# Patient Record
Sex: Female | Born: 1975 | Marital: Single | Smoking: Never smoker
Health system: Southern US, Community
[De-identification: ages and names within clinical notes are randomized; demographics above are authoritative.]

## PROBLEM LIST (undated history)

## (undated) ENCOUNTER — Emergency Department (HOSPITAL_COMMUNITY): Admission: EM | Payer: BLUE CROSS/BLUE SHIELD | Source: Home / Self Care

---

## 2004-10-22 ENCOUNTER — Observation Stay: Payer: Self-pay

## 2005-01-27 ENCOUNTER — Observation Stay: Payer: Self-pay | Admitting: Obstetrics and Gynecology

## 2005-02-11 ENCOUNTER — Inpatient Hospital Stay: Payer: Self-pay

## 2011-03-21 ENCOUNTER — Inpatient Hospital Stay: Payer: Self-pay | Admitting: Specialist

## 2011-03-21 DIAGNOSIS — I6789 Other cerebrovascular disease: Secondary | ICD-10-CM

## 2012-05-10 ENCOUNTER — Emergency Department: Payer: Self-pay | Admitting: Emergency Medicine

## 2013-08-29 DIAGNOSIS — T2020XA Burn of second degree of head, face, and neck, unspecified site, initial encounter: Secondary | ICD-10-CM | POA: Insufficient documentation

## 2018-06-23 ENCOUNTER — Encounter (INDEPENDENT_AMBULATORY_CARE_PROVIDER_SITE_OTHER): Payer: Self-pay

## 2018-06-23 ENCOUNTER — Ambulatory Visit
Admission: RE | Admit: 2018-06-23 | Discharge: 2018-06-23 | Disposition: A | Discharge status: Home / Self Care | Payer: Self-pay | Source: Ambulatory Visit | Attending: Oncology | Admitting: Oncology

## 2018-06-23 ENCOUNTER — Ambulatory Visit: Payer: Self-pay | Attending: Oncology

## 2018-06-23 VITALS — BP 133/76 | HR 72 | Temp 98.4°F | Ht 64.25 in | Wt 134.3 lb

## 2018-06-23 DIAGNOSIS — Z0000 Encounter for general adult medical examination without abnormal findings: Secondary | ICD-10-CM | POA: Insufficient documentation

## 2018-06-23 NOTE — Progress Notes (Signed)
  Subjective:     Patient ID: Kathleen KoyanagiHilda Rodriguez Knight, female   DOB: [birth year], 42 y.o.   MRN: [id number]  HPI   Review of Systems     Objective:   Physical Exam  Pulmonary/Chest: Right breast exhibits no inverted nipple, no mass, no nipple discharge, no skin change and no tenderness. Left breast exhibits no inverted nipple, no mass, no nipple discharge, no skin change and no tenderness. Breasts are symmetrical.  Left breast larger than right       Assessment:     42 year old hispanic patient presents for BCCCP clinic visit.  Patient screened, and meets BCCCP eligibility.  Patient does not have insurance, Medicare or Medicaid.  Handout given on Affordable Care Act.  Instructed patient on breast self awareness using teach back method.  Clinical breast exam unremarkable.  No mass or lump palpated.   Patient's sister diagnosed with breast cancer at age 43 in [place]. Kathleen HaringLoyda Knight interpreted exam.    Risk Assessment    Risk Scores      06/23/2018   Last edited by: Alta Corningover, Melissa G, CMA   5-year risk: 1.4 %   Lifetime risk: 14.6 %              Plan:     Sent for bilateral screening baseline mammogram.

## 2018-06-24 ENCOUNTER — Other Ambulatory Visit: Payer: Self-pay

## 2018-06-24 DIAGNOSIS — R920 Mammographic microcalcification found on diagnostic imaging of breast: Secondary | ICD-10-CM

## 2018-07-07 ENCOUNTER — Ambulatory Visit
Admission: RE | Admit: 2018-07-07 | Discharge: 2018-07-07 | Disposition: A | Discharge status: Home / Self Care | Payer: Self-pay | Source: Ambulatory Visit | Attending: Oncology | Admitting: Oncology

## 2018-07-07 DIAGNOSIS — R920 Mammographic microcalcification found on diagnostic imaging of breast: Secondary | ICD-10-CM

## 2018-10-06 ENCOUNTER — Other Ambulatory Visit: Payer: Self-pay

## 2018-10-06 DIAGNOSIS — R920 Mammographic microcalcification found on diagnostic imaging of breast: Secondary | ICD-10-CM

## 2018-10-07 NOTE — Progress Notes (Signed)
Radiologist reviewed mammographic findings, and Birads 3 result with patient.  Letter mailed to patient to notify of scheduled six month follow-up mammogram on 01/07/2019. Copy to [facility].

## 2019-01-07 ENCOUNTER — Other Ambulatory Visit: Payer: Self-pay

## 2019-01-07 ENCOUNTER — Ambulatory Visit
Admission: RE | Admit: 2019-01-07 | Discharge: 2019-01-07 | Disposition: A | Discharge status: Home / Self Care | Payer: Self-pay | Source: Ambulatory Visit | Attending: Oncology | Admitting: Oncology

## 2019-01-07 DIAGNOSIS — R920 Mammographic microcalcification found on diagnostic imaging of breast: Secondary | ICD-10-CM | POA: Insufficient documentation

## 2019-01-11 ENCOUNTER — Encounter: Payer: Self-pay | Admitting: *Deleted

## 2019-01-11 ENCOUNTER — Other Ambulatory Visit: Payer: Self-pay | Admitting: *Deleted

## 2019-01-11 DIAGNOSIS — R920 Mammographic microcalcification found on diagnostic imaging of breast: Secondary | ICD-10-CM

## 2019-01-11 NOTE — Progress Notes (Signed)
Letter mailed to inform patient of her mammogram results and need to return in 6 months.  I have sent her a letter with her appointment for 07/12/19 @ 8:30 for BCCCP and then 10:00 for her mammogram.  Interpreter request place.  [facility] to [place].

## 2019-04-29 ENCOUNTER — Ambulatory Visit (INDEPENDENT_AMBULATORY_CARE_PROVIDER_SITE_OTHER): Payer: BLUE CROSS/BLUE SHIELD | Admitting: Surgery

## 2019-04-29 ENCOUNTER — Other Ambulatory Visit: Payer: Self-pay

## 2019-04-29 ENCOUNTER — Encounter: Payer: Self-pay | Admitting: Surgery

## 2019-04-29 VITALS — BP 108/73 | HR 80 | Temp 97.7°F | Resp 14 | Ht 63.0 in | Wt 138.0 lb

## 2019-04-29 DIAGNOSIS — K644 Residual hemorrhoidal skin tags: Secondary | ICD-10-CM | POA: Diagnosis not present

## 2019-04-29 MED ORDER — LIDOCAINE (ANORECTAL) 5 % EX CREA: 1.0000 "application " | TOPICAL_CREAM | Freq: Four times a day (QID) | CUTANEOUS | 0 refills | Status: DC

## 2019-04-29 NOTE — Patient Instructions (Addendum)
Please pick up your medication at the pharmacy and begin using it today. Please continue to use the suppositories.    Contenido de [place] de los alimentos (Cardinal Health Content in Foods) Consulte la lista a continuacin para Armed forces logistics/support/administrative officer contenido de fibra de algunos alimentos que se consumen con frecuencia. ALIMENTOS CON ALTO CONTENIDO DE FIBRA Los alimentos ricos en fibra contienen 4gramos o ms de fibra por porcin. Estos incluyen los siguientes:  Alcachofa (fresca): 28mediana tiene 10,3g de [place].  Frijoles cocidos, comunes o vegetarianos (en lata), taza tiene 5,2g de fibra.  Arndanos o frambuesas (frescas): taza tiene 4g de fibra.  Cereal de salvado: taza tiene 8,6g de fibra.  Trigo burgol (cocido): taza tiene 4g de [place].  Frijoles (en lata): taza tiene 6,8g de fibra.  Lentejas (cocidas): taza tiene 7,8g de fibra.  Pera (fresca): 52mediana tiene 5,1g de [place].  Guisantes (congelados): taza tiene 4,4g de [place].  Frijoles pintos (en lata): taza tiene 5,5g de fibra.  Frijoles pintos (secos y cocidos): taza tiene 7,7g de [place].  Papa con piel (al horno): 30mediana tiene 4,4g de [place].  Quinua (cocida): taza tiene 5g de fibra.  Porotos de soja (en lata, congelados o frescos): taza tiene 5,1g de fibra. ALIMENTOS CON CONTENIDO MODERADO DE FIBRA Los alimentos con contenido moderado de [place] de 1 a 4gramos de fibra por porcin. Estos incluyen los siguientes:  Almendras: 1onza tiene 3,5g de [place].  Manzana con piel: 47mediana tiene 3,3g de fibra.  Pur de Firefighter (endulzado): taza tiene 1,5g de Pharmacist, hospital.  Bagel solo: un bagel de 4pulgadas (10cm) tiene 2g de fibra.  Banana: 27mediana tiene 3,1g de fibra.  Brcoli (cocido): taza tiene 2,5g de [place].  Zanahorias (cocidas): taza tiene 2,3g de [place].  Maz (en lata o congelado): taza tiene 2,1g de [place].  Tortilla de maz: una tortilla de 6pulgadas (15cm) tiene 1,5g  de [place].  Judas verdes (en lata): taza tiene 2g de fibra.  Avena instantnea: taza tiene aproximadamente 2g de fibra.  Arroz integral de Set designer (cocido): 1taza tiene 3,5g de Pharmacist, hospital.  Macarrones enriquecidos (cocidos): 1 taza tiene 2,5g de [place].  Meln: 1taza tiene 1,4g de fibra.  Multicereales: taza tiene aproximadamente 2 a 4g de fibra.  Naranja: 1pequea tiene 3,1g de fibra.  Pur de papas: taza tiene 1,6g de fibra.  Pasas: taza tiene 1,6g de fibra.  Calabaza: taza tiene 2,9g de [place].  Semillas de girasol: taza tiene 1,1g de [place].  Tomate: 57mediano tiene 1,5g de fibra.  Hamburguesa de vegetales o de soja: 1 tiene 3,4g de [place].  Pan de salvado: 1rebanada tiene 2g de [place].  Espaguetis de salvado: taza tiene 3,2g de fibra. ALIMENTOS CON BAJO CONTENIDO DE FIBRA Los alimentos con bajo contenido de [place] tienen menos de 1gramo de fibra por porcin. Estos incluyen los siguientes:  Huevo: 1grande.  Tortilla de harina: una tortilla de 6pulgadas (15cm).  Jugo de fruta: taza.  Valeda MalmSharen Counter.  Carne de res, ave o pescado: 1onza.  Leche: 1taza.  Espinaca (cruda): 1taza.  Pan blanco: 1rebanada.  Arroz blanco: taza.  Yogur: taza. Las cantidades reales de fibra de los alimentos pueden ser diferentes en funcin del procesamiento. Hable con el nutricionista sobre la cantidad de fibra que necesita en la dieta. Esta informacin no tiene Marine scientist el consejo del mdico. Asegrese de hacerle al mdico cualquier pregunta que tenga. Document Released: 11/01/2012 Document Revised: 08/07/2016 Document Reviewed: 08/30/2015 Elsevier Patient Education  2020 [place] tomar un bao de asiento How  to Take a [employer] bao de asiento es un bao de agua tibia que se puede usar para cuidar el recto, la zona genital o la zona entre el recto y los genitales (perineo). En un bao de asiento, el agua  solamente llega [employer] caderas y [place] las nalgas. Un bao de asiento puede [employer] hogar en la baera o en una tina porttil para bao de asiento que se coloca sobre el inodoro. Su mdico puede recomendar un bao de asiento para ayudarlo con lo siguiente:  Engineer, materials y las molestias despus de dar a Patent examiner.  Aliviar el dolor y la picazn causados por las hemorroides o las fisuras anales.  Aliviar el dolor despus de determinadas cirugas.  Relajar los msculos doloridos o tensos. Cmo tomar un bao de [employer] 3 o 4baos de asiento diarios o tantos como se lo haya indicado el mdico. Bao de asiento en la baera Para tomar un bao de asiento en una baera: 1. Llene parte de la baera con agua tibia. El agua debe tener la profundidad suficiente para cubrirle las caderas y las nalgas cuando est sentado en la baera. 2. Si su mdico le indic que ponga medicamentos en el agua, siga sus instrucciones. 3. Sintese en el agua. 4. Delsa Bern un poco el drenaje de la baera y djelo abierto durante su bao. 5. Abra el agua tibia nuevamente, lo suficiente para reponer Firefighter. Deje correr el agua durante todo su bao. Esto ayuda a Surveyor, minerals en el nivel adecuado y a Landscape architect. 6. Sumrjase en el agua entre 15 y 20 minutos, o el tiempo que le haya indicado el mdico. 7. Cuando termine, tenga cuidado al ponerse de pie. Puede sentirse mareado. 8. Luego del bao de asiento, squese con golpecitos suaves. No frote la piel para secarla.  Bao de asiento sobre el inodoro Para tomar un bao de asiento con un recipiente sobre el inodoro: 1. Siga las instrucciones del fabricante. 2. Llene el recipiente con agua tibia. 3. Si su mdico le indic que ponga medicamentos en el agua, siga sus instrucciones. 4. Sintese en el asiento. Asegrese de que el agua le cubra las nalgas y el perineo. 5. Sumrjase en el agua entre 15 y 20 minutos, o el tiempo que le haya  indicado el mdico. 6. Luego del bao de asiento, squese con golpecitos suaves. No frote la piel para secarla. 7. Limpie y seque la tina despus de cada uso. 8. Deseche el recipiente si se agrieta o segn las instrucciones del fabricante. Comunquese con un mdico si:  Los sntomas empeoran. No contine con los baos de asiento si sus sntomas empeoran.  Aparecen nuevos sntomas. Si esto ocurre, no contine con los baos de asiento hasta hablar con su mdico. Resumen  Un bao de asiento es un bao con agua tibia en el cual el agua solo le llega hasta la cadera y cubre las nalgas.  Un bao de asiento puede [employer] picazn y Chief Technology Officer, y International aid/development worker los msculos doloridos o tensos en la parte inferior del cuerpo, incluida la zona genital.  Alden 3 o 4baos de asiento diarios o tantos como se lo haya indicado el mdico. Sumrjase en el agua entre 15 y 20 minutos.  No contine con los baos de asiento si los sntomas empeoran. Esta informacin no tiene Theme park manager el consejo del mdico. Asegrese de hacerle al mdico cualquier pregunta que tenga. Document Released: 08/18/2006 Document Revised:  08/15/2017 Document Reviewed: 08/15/2017 Elsevier Patient Education  2020 [employer]

## 2019-05-01 ENCOUNTER — Encounter: Payer: Self-pay | Admitting: Surgery
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
## 2019-07-08 ENCOUNTER — Telehealth: Payer: Self-pay

## 2019-07-08 NOTE — Telephone Encounter (Signed)
Pt called back. Interpreter used: Ronnald Collum  Pt now has [employer] and no longer qualifies for [employer]. She will report to her scheduled appointment at [facility] on 07/12/2019 and bring her ID and Insurance card. Al Pimple is aware of this change.

## 2019-07-11 ENCOUNTER — Other Ambulatory Visit: Payer: Self-pay | Admitting: Family Medicine

## 2019-07-11 DIAGNOSIS — R921 Mammographic calcification found on diagnostic imaging of breast: Secondary | ICD-10-CM

## 2019-07-12 ENCOUNTER — Other Ambulatory Visit: Payer: Self-pay

## 2019-07-12 ENCOUNTER — Ambulatory Visit: Payer: BLUE CROSS/BLUE SHIELD

## 2019-08-25 ENCOUNTER — Ambulatory Visit
Admission: RE | Admit: 2019-08-25 | Discharge: 2019-08-25 | Disposition: A | Discharge status: Home / Self Care | Payer: BLUE CROSS/BLUE SHIELD | Source: Ambulatory Visit | Attending: Family Medicine | Admitting: Family Medicine

## 2019-08-25 ENCOUNTER — Other Ambulatory Visit: Payer: Self-pay | Admitting: Family Medicine

## 2019-08-25 ENCOUNTER — Other Ambulatory Visit: Payer: Self-pay

## 2019-08-25 DIAGNOSIS — R921 Mammographic calcification found on diagnostic imaging of breast: Secondary | ICD-10-CM | POA: Insufficient documentation

## 2019-08-29 ENCOUNTER — Other Ambulatory Visit
Admission: RE | Admit: 2019-08-29 | Discharge: 2019-08-29 | Disposition: A | Discharge status: Home / Self Care | Payer: BLUE CROSS/BLUE SHIELD | Source: Ambulatory Visit | Attending: Pediatrics | Admitting: Pediatrics

## 2019-08-29 ENCOUNTER — Other Ambulatory Visit: Payer: Self-pay

## 2019-08-29 DIAGNOSIS — R0781 Pleurodynia: Secondary | ICD-10-CM | POA: Diagnosis present

## 2019-08-29 LAB — FIBRIN DERIVATIVES D-DIMER (ARMC ONLY): Fibrin derivatives D-dimer (ARMC): 592.12 ng/mL (FEU) — ABNORMAL HIGH (ref 0.00–499.00)

## 2019-10-08 ENCOUNTER — Ambulatory Visit: Payer: BLUE CROSS/BLUE SHIELD | Attending: Internal Medicine

## 2019-10-08 DIAGNOSIS — Z23 Encounter for immunization: Secondary | ICD-10-CM

## 2019-10-08 NOTE — Progress Notes (Signed)
   Covid-19 Vaccination Clinic  Name:  Kathleen Knight    MRN: [id number] DOB: 03/01/1976  10/08/2019  Ms. [name] was observed post Covid-19 immunization for 15 minutes without incident. She was provided with Vaccine Information Sheet and instruction to access the V-Safe system.   Ms. [name] was instructed to call 911 with any severe reactions post vaccine: Marland Kitchen Difficulty breathing  . Swelling of face and throat  . A fast heartbeat  . A bad rash all over body  . Dizziness and weakness   Immunizations Administered    Name Date Dose VIS Date Route   Pfizer COVID-19 Vaccine 10/08/2019  6:38 PM 0.3 mL 07/01/2019 Intramuscular   Manufacturer: [employer], [employer]   Lot: [id number]   NDC: [address]-3

## 2019-10-29 ENCOUNTER — Ambulatory Visit: Payer: BLUE CROSS/BLUE SHIELD | Attending: Internal Medicine

## 2019-10-29 DIAGNOSIS — Z23 Encounter for immunization: Secondary | ICD-10-CM

## 2019-10-29 NOTE — Progress Notes (Signed)
   Covid-19 Vaccination Clinic  Name:  Kathleen Knight    MRN: [id number] DOB: 05/09/1976  10/29/2019  Kathleen Knight was observed post Covid-19 immunization for 15 minutes without incident. She was provided with Vaccine Information Sheet and instruction to access the V-Safe system. Medical Interpreter used.  Kathleen Knight was instructed to call 911 with any severe reactions post vaccine: Marland Kitchen Difficulty breathing  . Swelling of face and throat  . A fast heartbeat  . A bad rash all over body  . Dizziness and weakness   Immunizations Administered    Name Date Dose VIS Date Route   Pfizer COVID-19 Vaccine 10/29/2019  4:33 PM 0.3 mL 07/01/2019 Intramuscular   Manufacturer: [employer], [employer]   Lot: [id number]   NDC: [address]-1

## 2019-11-23 DIAGNOSIS — R519 Headache, unspecified: Secondary | ICD-10-CM | POA: Diagnosis not present

## 2020-03-14 DIAGNOSIS — L237 Allergic contact dermatitis due to plants, except food: Secondary | ICD-10-CM | POA: Diagnosis not present

## 2020-05-29 DIAGNOSIS — R519 Headache, unspecified: Secondary | ICD-10-CM | POA: Diagnosis not present

## 2020-07-26 DIAGNOSIS — R509 Fever, unspecified: Secondary | ICD-10-CM | POA: Diagnosis not present

## 2020-07-26 DIAGNOSIS — R519 Headache, unspecified: Secondary | ICD-10-CM | POA: Diagnosis not present

## 2020-07-26 DIAGNOSIS — R059 Cough, unspecified: Secondary | ICD-10-CM | POA: Diagnosis not present

## 2021-05-29 DIAGNOSIS — R519 Headache, unspecified: Secondary | ICD-10-CM | POA: Diagnosis not present

## 2021-05-29 DIAGNOSIS — H53149 Visual discomfort, unspecified: Secondary | ICD-10-CM | POA: Diagnosis not present

## 2021-05-29 DIAGNOSIS — R110 Nausea: Secondary | ICD-10-CM | POA: Diagnosis not present

## 2021-06-06 ENCOUNTER — Other Ambulatory Visit: Payer: Self-pay

## 2021-06-06 ENCOUNTER — Emergency Department
Admission: EM | Admit: 2021-06-06 | Discharge: 2021-06-06 | Disposition: A | Discharge status: Home / Self Care | Payer: 59 | Attending: Emergency Medicine | Admitting: Emergency Medicine

## 2021-06-06 ENCOUNTER — Emergency Department: Payer: 59

## 2021-06-06 DIAGNOSIS — Y92000 Kitchen of unspecified non-institutional (private) residence as  the place of occurrence of the external cause: Secondary | ICD-10-CM | POA: Diagnosis not present

## 2021-06-06 DIAGNOSIS — R7309 Other abnormal glucose: Secondary | ICD-10-CM | POA: Insufficient documentation

## 2021-06-06 DIAGNOSIS — R9431 Abnormal electrocardiogram [ECG] [EKG]: Secondary | ICD-10-CM | POA: Diagnosis not present

## 2021-06-06 DIAGNOSIS — S060X1A Concussion with loss of consciousness of 30 minutes or less, initial encounter: Secondary | ICD-10-CM | POA: Insufficient documentation

## 2021-06-06 DIAGNOSIS — W01198A Fall on same level from slipping, tripping and stumbling with subsequent striking against other object, initial encounter: Secondary | ICD-10-CM | POA: Insufficient documentation

## 2021-06-06 DIAGNOSIS — W19XXXA Unspecified fall, initial encounter: Secondary | ICD-10-CM

## 2021-06-06 DIAGNOSIS — S0083XA Contusion of other part of head, initial encounter: Secondary | ICD-10-CM | POA: Insufficient documentation

## 2021-06-06 DIAGNOSIS — Y9301 Activity, walking, marching and hiking: Secondary | ICD-10-CM | POA: Diagnosis not present

## 2021-06-06 DIAGNOSIS — R519 Headache, unspecified: Secondary | ICD-10-CM | POA: Diagnosis not present

## 2021-06-06 DIAGNOSIS — S0990XA Unspecified injury of head, initial encounter: Secondary | ICD-10-CM | POA: Diagnosis present

## 2021-06-06 LAB — CBC WITH DIFFERENTIAL/PLATELET
Abs Immature Granulocytes: 0.01 10*3/uL (ref 0.00–0.07)
Basophils Absolute: 0 10*3/uL (ref 0.0–0.1)
Basophils Relative: 0 %
Eosinophils Absolute: 0.2 10*3/uL (ref 0.0–0.5)
Eosinophils Relative: 3 %
HCT: 33.4 % — ABNORMAL LOW (ref 36.0–46.0)
Hemoglobin: 10.1 g/dL — ABNORMAL LOW (ref 12.0–15.0)
Immature Granulocytes: 0 %
Lymphocytes Relative: 30 %
Lymphs Abs: 2.4 10*3/uL (ref 0.7–4.0)
MCH: 22.8 pg — ABNORMAL LOW (ref 26.0–34.0)
MCHC: 30.2 g/dL (ref 30.0–36.0)
MCV: 75.4 fL — ABNORMAL LOW (ref 80.0–100.0)
Monocytes Absolute: 0.4 10*3/uL (ref 0.1–1.0)
Monocytes Relative: 6 %
Neutro Abs: 4.8 10*3/uL (ref 1.7–7.7)
Neutrophils Relative %: 61 %
Platelets: 302 10*3/uL (ref 150–400)
RBC: 4.43 MIL/uL (ref 3.87–5.11)
RDW: 16.2 % — ABNORMAL HIGH (ref 11.5–15.5)
WBC: 7.8 10*3/uL (ref 4.0–10.5)
nRBC: 0 % (ref 0.0–0.2)

## 2021-06-06 LAB — BASIC METABOLIC PANEL
Anion gap: 6 (ref 5–15)
BUN: 8 mg/dL (ref 6–20)
CO2: 24 mmol/L (ref 22–32)
Calcium: 8.8 mg/dL — ABNORMAL LOW (ref 8.9–10.3)
Chloride: 108 mmol/L (ref 98–111)
Creatinine, Ser: 0.56 mg/dL (ref 0.44–1.00)
GFR, Estimated: >60 mL/min (ref >60)
Glucose, Bld: 123 mg/dL — ABNORMAL HIGH (ref 70–99)
Potassium: 3.4 mmol/L — ABNORMAL LOW (ref 3.5–5.1)
Sodium: 138 mmol/L (ref 135–145)

## 2021-06-06 LAB — POC URINE PREG, ED: Preg Test, Ur: NEGATIVE

## 2021-06-06 MED ORDER — IBUPROFEN 600 MG PO TABS: ORAL_TABLET | ORAL | 0 refills | Status: DC

## 2021-06-06 MED ORDER — ONDANSETRON 4 MG PO TBDP: ORAL_TABLET | ORAL | 0 refills | Status: DC

## 2021-06-06 MED ORDER — ONDANSETRON 4 MG PO TBDP: 4.0000 mg | ORAL_TABLET | Freq: Once | ORAL | Status: DC

## 2021-06-06 MED ORDER — IBUPROFEN 600 MG PO TABS: 600.0000 mg | ORAL_TABLET | ORAL | Status: DC
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
## 2021-06-06 NOTE — ED Triage Notes (Signed)
Pt here with a fall that happened on yesterday. Pt states that she fell face forward and hit her head but does not remember if she loss consciousness. Pt states that she has a severe headache and is nauseous with blurred vision. Pt in NAD in triage.

## 2021-08-02 DIAGNOSIS — H1131 Conjunctival hemorrhage, right eye: Secondary | ICD-10-CM | POA: Diagnosis not present

## 2022-01-03 IMAGING — CT CT HEAD W/O CM
4 series · 17 of 47 positions shown, 19 images · non-contrast
Comparison: 05/10/2012

CLINICAL DATA: Fall yesterday, head trauma, facial injury,
headache, blurred vision, nausea

EXAM:
CT HEAD WITHOUT CONTRAST
TECHNIQUE: Contiguous axial images were obtained from the base of the skull
through the vertex without intravenous contrast.

[Series 2: head bone · axial · 0.42mm/px · z∈[+324,+380]mm · 4 of 82 slices shown]
[im 9/82  bone]
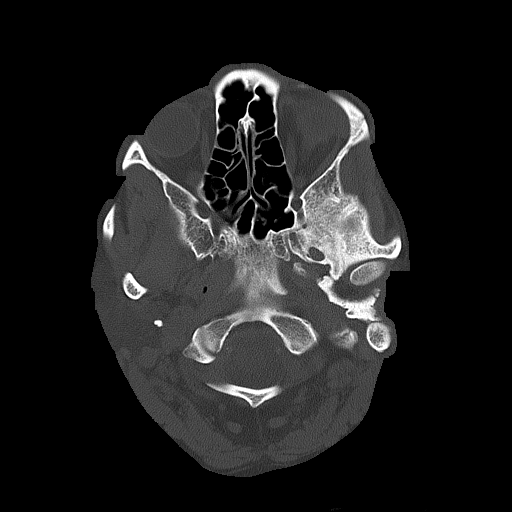
[im 17/82  bone]
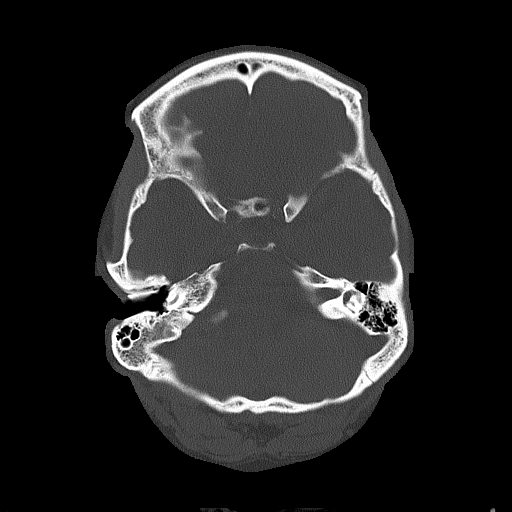
[im 25/82  bone]
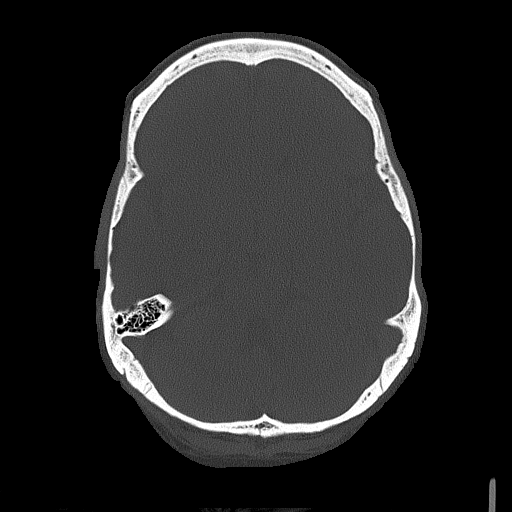
[im 37/82  bone]
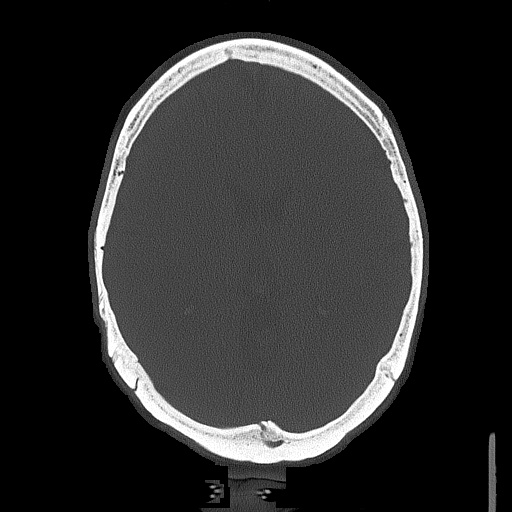

[Series 3: head wo · axial · 0.42mm/px · z∈[+328,+448]mm · 7 of 33 slices shown, 9 images]
[im 5/33  brain]
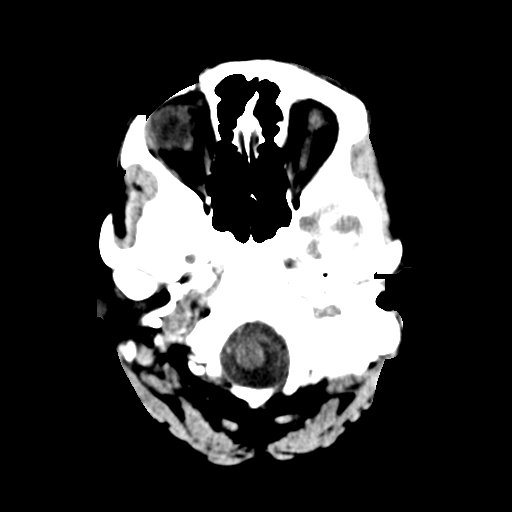
[im 5/33  bone]
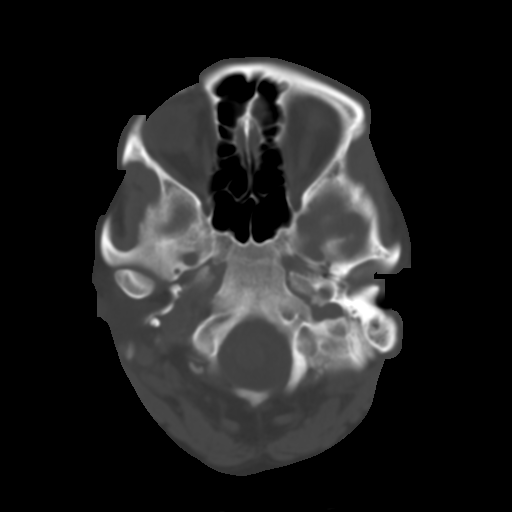
[im 9/33  brain]
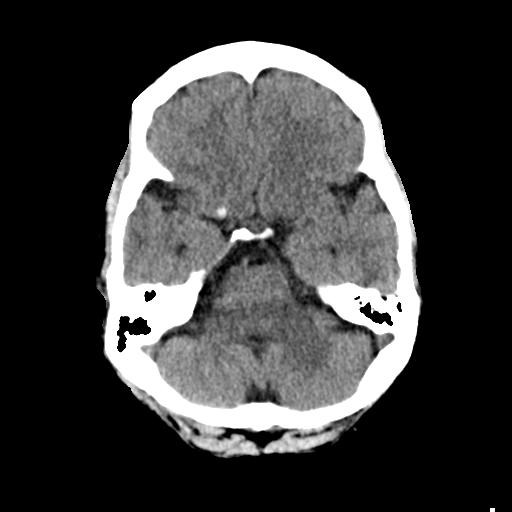
[im 13/33  brain]
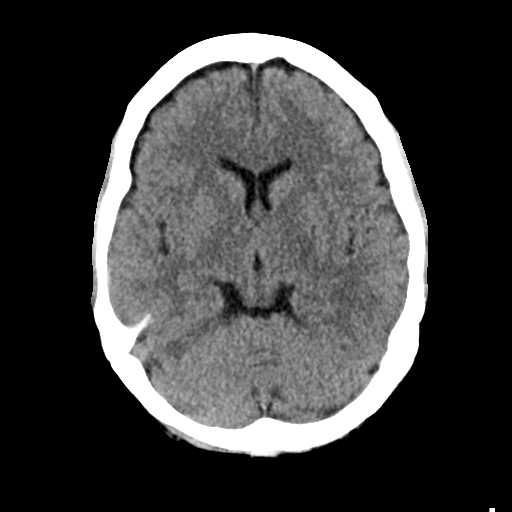
[im 17/33  brain]
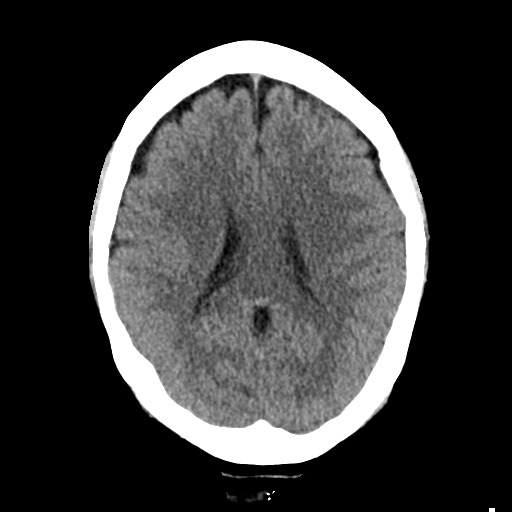
[im 21/33  brain]
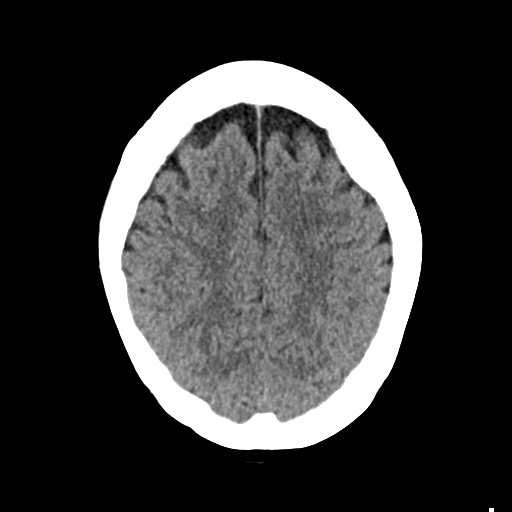
[im 21/33  bone]
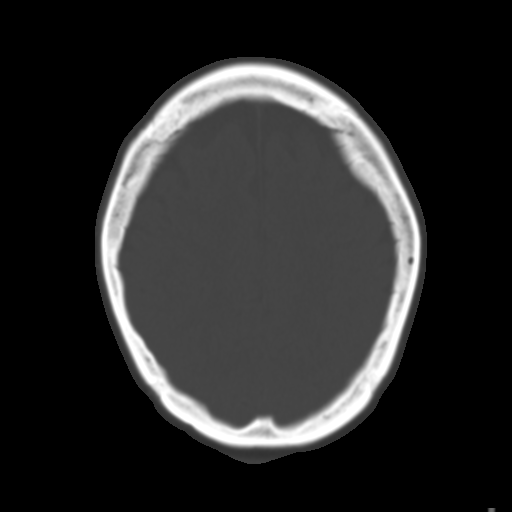
[im 25/33  brain]
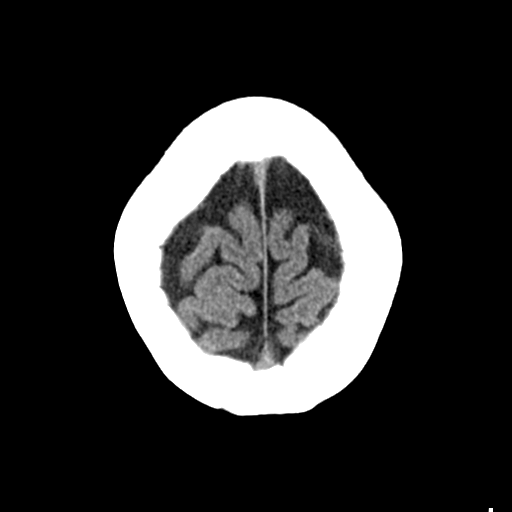
[im 29/33  brain]
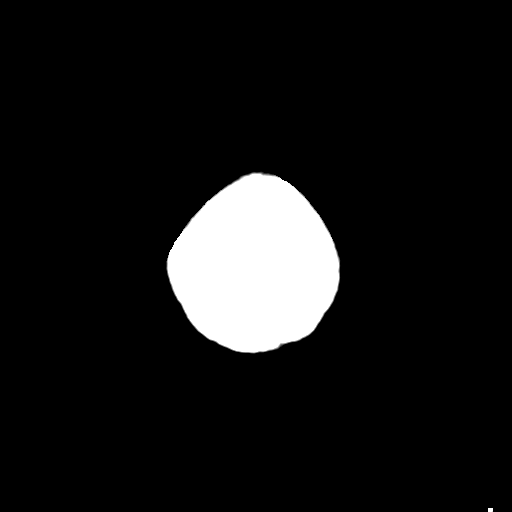

[Series 4: coronal soft tissue · coronal · 0.30mm/px · 3 of 66 slices shown]
[im 22/66  brain]
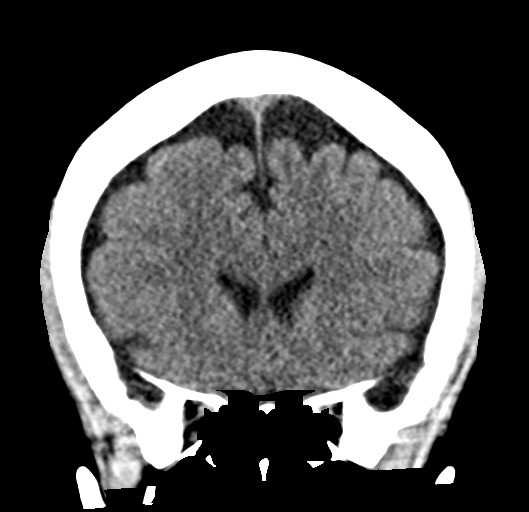
[im 29/66  brain]
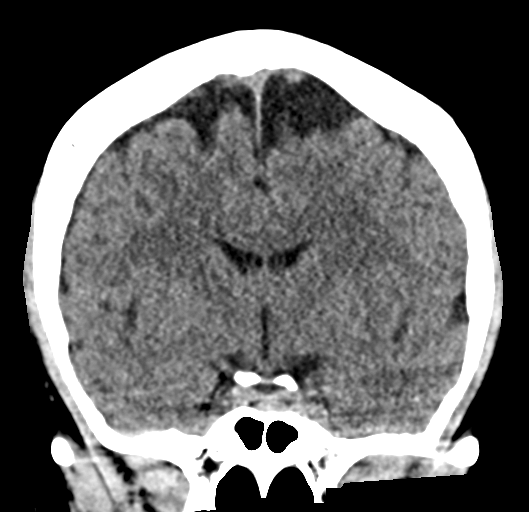
[im 37/66  brain]
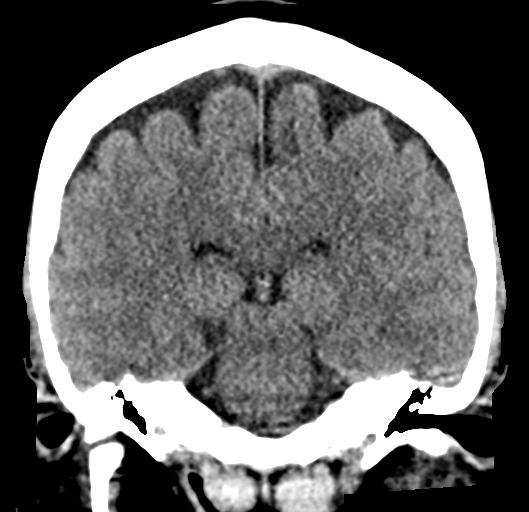

[Series 5: sagittal soft tissue · sagittal · 0.30mm/px · 3 of 54 slices shown]
[im 18/54  brain]
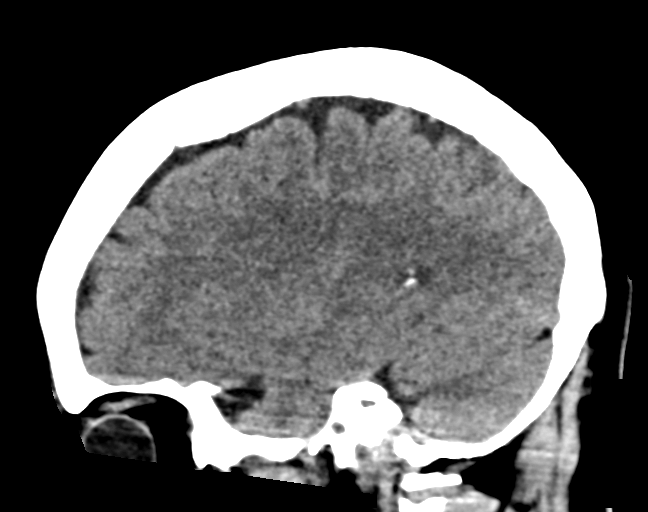
[im 27/54  brain]
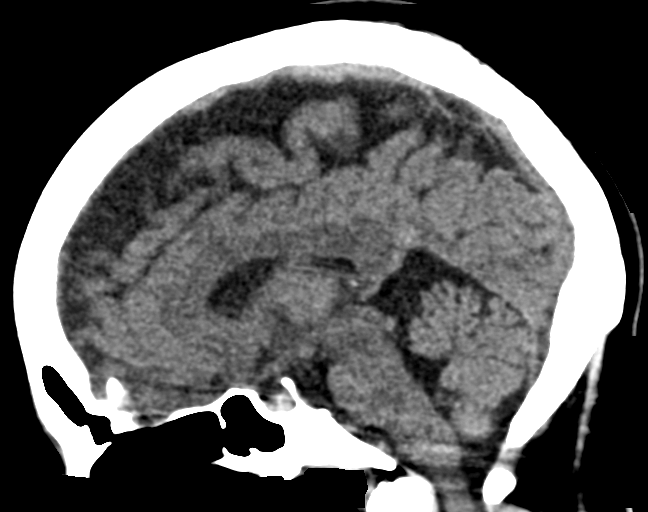
[im 36/54  brain]
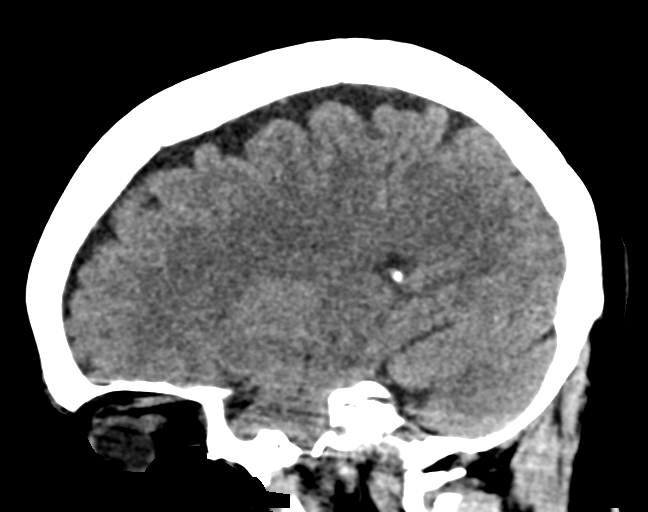

[17 of 47 positions shown; findings below may reference images not displayed]

FINDINGS: Brain: No evidence of acute infarction, hemorrhage, hydrocephalus,
extra-axial collection or mass lesion/mass effect.

Vascular: No hyperdense vessel or unexpected calcification.

Skull: Normal. Negative for fracture or focal lesion.

Sinuses/Orbits: No acute finding.

Other: None.
IMPRESSION: No acute intracranial pathology.

## 2022-02-25 DIAGNOSIS — J029 Acute pharyngitis, unspecified: Secondary | ICD-10-CM | POA: Diagnosis not present

## 2022-02-25 DIAGNOSIS — U071 COVID-19: Secondary | ICD-10-CM | POA: Diagnosis not present

## 2022-02-25 DIAGNOSIS — Z03818 Encounter for observation for suspected exposure to other biological agents ruled out: Secondary | ICD-10-CM | POA: Diagnosis not present

## 2022-04-08 ENCOUNTER — Emergency Department: Payer: 59

## 2022-04-08 ENCOUNTER — Observation Stay
Admission: EM | Admit: 2022-04-08 | Discharge: 2022-04-09 | Disposition: A | Discharge status: Home / Self Care | Payer: 59 | Attending: Internal Medicine | Admitting: Internal Medicine

## 2022-04-08 ENCOUNTER — Other Ambulatory Visit: Payer: Self-pay

## 2022-04-08 ENCOUNTER — Encounter: Payer: Self-pay | Admitting: Internal Medicine

## 2022-04-08 DIAGNOSIS — E781 Pure hyperglyceridemia: Secondary | ICD-10-CM | POA: Insufficient documentation

## 2022-04-08 DIAGNOSIS — R072 Precordial pain: Secondary | ICD-10-CM | POA: Diagnosis not present

## 2022-04-08 DIAGNOSIS — Z8616 Personal history of COVID-19: Secondary | ICD-10-CM | POA: Diagnosis not present

## 2022-04-08 DIAGNOSIS — Z79899 Other long term (current) drug therapy: Secondary | ICD-10-CM | POA: Insufficient documentation

## 2022-04-08 DIAGNOSIS — R079 Chest pain, unspecified: Secondary | ICD-10-CM | POA: Diagnosis present

## 2022-04-08 DIAGNOSIS — R0789 Other chest pain: Secondary | ICD-10-CM | POA: Diagnosis present

## 2022-04-08 LAB — CBC
HCT: 36.7 % (ref 36.0–46.0)
Hemoglobin: 11.5 g/dL — ABNORMAL LOW (ref 12.0–15.0)
MCH: 25.8 pg — ABNORMAL LOW (ref 26.0–34.0)
MCHC: 31.3 g/dL (ref 30.0–36.0)
MCV: 82.3 fL (ref 80.0–100.0)
Platelets: 226 10*3/uL (ref 150–400)
RBC: 4.46 MIL/uL (ref 3.87–5.11)
RDW: 19 % — ABNORMAL HIGH (ref 11.5–15.5)
WBC: 7.8 10*3/uL (ref 4.0–10.5)
nRBC: 0 % (ref 0.0–0.2)

## 2022-04-08 LAB — POC URINE PREG, ED: Preg Test, Ur: NEGATIVE

## 2022-04-08 LAB — BASIC METABOLIC PANEL
Anion gap: 5 (ref 5–15)
BUN: 9 mg/dL (ref 6–20)
CO2: 26 mmol/L (ref 22–32)
Calcium: 9.4 mg/dL (ref 8.9–10.3)
Chloride: 104 mmol/L (ref 98–111)
Creatinine, Ser: 0.57 mg/dL (ref 0.44–1.00)
GFR, Estimated: >60 mL/min (ref >60)
Glucose, Bld: 107 mg/dL — ABNORMAL HIGH (ref 70–99)
Potassium: 3.9 mmol/L (ref 3.5–5.1)
Sodium: 135 mmol/L (ref 135–145)

## 2022-04-08 LAB — TROPONIN I (HIGH SENSITIVITY)
Troponin I (High Sensitivity): <2 ng/L (ref <18)
Troponin I (High Sensitivity): <2 ng/L (ref <18)

## 2022-04-08 MED ORDER — POLYETHYLENE GLYCOL 3350 17 G PO PACK: 17.0000 g | PACK | Freq: Every day | ORAL | Status: DC | PRN

## 2022-04-08 MED ORDER — ENOXAPARIN SODIUM 40 MG/0.4ML IJ SOSY
40.0000 mg | PREFILLED_SYRINGE | INTRAMUSCULAR | Status: DC
  Administered 2022-04-08: 40 mg via SUBCUTANEOUS
  Filled 2022-04-08: qty 0.4

## 2022-04-08 MED ORDER — ASPIRIN 325 MG PO TABS: 325.0000 mg | ORAL_TABLET | Freq: Once | ORAL | Status: DC

## 2022-04-08 MED ORDER — SODIUM CHLORIDE 0.9 % IV SOLN: INTRAVENOUS | Status: DC

## 2022-04-08 MED ORDER — ONDANSETRON HCL 4 MG/2ML IJ SOLN: 4.0000 mg | Freq: Four times a day (QID) | INTRAMUSCULAR | Status: DC | PRN

## 2022-04-08 MED ORDER — MELATONIN 5 MG PO TABS: 5.0000 mg | ORAL_TABLET | Freq: Every evening | ORAL | Status: DC | PRN

## 2022-04-08 MED ORDER — ASPIRIN 81 MG PO CHEW
324.0000 mg | CHEWABLE_TABLET | Freq: Once | ORAL | Status: AC
  Administered 2022-04-08: 324 mg via ORAL
  Filled 2022-04-08: qty 4

## 2022-04-08 MED ORDER — ACETAMINOPHEN 325 MG PO TABS
650.0000 mg | ORAL_TABLET | Freq: Four times a day (QID) | ORAL | Status: DC | PRN
  Administered 2022-04-09: 650 mg via ORAL
  Filled 2022-04-08: qty 2

## 2022-04-08 MED ORDER — NITROGLYCERIN 0.4 MG SL SUBL
0.4000 mg | SUBLINGUAL_TABLET | SUBLINGUAL | Status: DC | PRN
  Administered 2022-04-08 (×2): 0.4 mg via SUBLINGUAL
  Filled 2022-04-08 (×2): qty 1

## 2022-04-08 NOTE — H&P (Addendum)
History and Physical  Kathleen Knight KDT:[id number] DOB: 03-06-1976 DOA: 04/08/2022  Referring physician: Dr. Joni Fears, [place]  PCP: Denton Lank, MD  Outpatient Specialists: None Patient coming from: Home  Chief Complaint: Chest pain   Interview done using interpreter [phone].  HPI: Kathleen Knight is a 47 y.o. female with no significant past medical history, not on any prescribed medication, who presented to [facility] ED with complaints of intermittent exertional chest pain, improved with rest and worsened with ambulation.  Onset this morning around 10 AM, the chest pain progressed to nonexertional.  She describes the pain as sharp, centrally located.  5 out of 10, nonradiating.  Nonpleuritic.  Associated with shortness of breath and lightheadedness.  No family history of heart disease.  No use of tobacco, alcohol, or illicit drugs.  Denies lower extremity edema or pain.  No lengthy trips.  Recent diagnosis of COVID-19 infection in February 25, 2022 for which she was managed outpatient.    In the ED, the patient's chest pain persists even at rest.  She received a dose of nitroglycerin with no improvement.  Heart score of 2.  First set of high-sensitivity troponin negative.  Twelve-lead EKG no evidence of acute ischemia.  EDP requested admission for further cardiac work-up.  EDP will consult cardiology.  TRH, hospitalist service, was asked to admit.  ED Course: Tmax 98.3.  BP 108/64, pulse 74, respiratory 16, O2 saturation 97% on room air.  High-sensitivity troponin negative x1.  Repeat troponin in process.  CBC and BMP essentially unremarkable.  Review of Systems: Review of systems as noted in the HPI. All other systems reviewed and are negative.  Past medical history: None.  Past surgical history: None.   Social History:  reports that she has never smoked. She has never used smokeless tobacco. She reports that she does not drink alcohol and does not use drugs.   No Known Allergies  Family  History  Problem Relation Age of Onset   Breast cancer Sister 71      Prior to Admission medications   Medication Sig Start Date End Date Taking? Authorizing Provider  cetirizine (ZYRTEC) 10 MG tablet Take 10 mg by mouth daily.    [provider]  ibuprofen (ADVIL) 600 MG tablet Take 1 tablet by mouth three times daily as needed with meals for headache 06/06/21   Delman Kitten, MD  Lidocaine, Anorectal, 5 % CREA Apply 1 application topically QID. 04/29/19   Olean Ree, MD  ondansetron (ZOFRAN ODT) 4 MG disintegrating tablet Allow 1-2 tablets to dissolve in your mouth every 8 hours as needed for nausea/vomiting 06/06/21   Delman Kitten, MD    Physical Exam: BP 108/64   Pulse 74   Temp (!) 97.4 F (36.3 C) (Oral)   Resp 16   Ht 5\' 2"  (1.575 m)   Wt 62.6 kg   LMP 11/12/2021 (Exact Date)   SpO2 97%   BMI 25.24 kg/m   General: 46 y.o. year-old female well developed well nourished in no acute distress.  Alert and oriented x3. Cardiovascular: Regular rate and rhythm with no rubs or gallops.  No thyromegaly or JVD noted.  No lower extremity edema. 2/4 pulses in all 4 extremities. Respiratory: Clear to auscultation with no wheezes or rales. Good inspiratory effort. Abdomen: Soft nontender nondistended with normal bowel sounds x4 quadrants. Muskuloskeletal: No cyanosis, clubbing or edema noted bilaterally Neuro: CN II-XII intact, strength, sensation, reflexes Skin: No ulcerative lesions noted or rashes Psychiatry: Judgement and insight appear normal. Mood  is appropriate for condition and setting          Labs on Admission:  Basic Metabolic Panel: Recent Labs  Lab 04/08/22 1415  NA 135  K 3.9  CL 104  CO2 26  GLUCOSE 107*  BUN 9  CREATININE 0.57  CALCIUM 9.4   Liver Function Tests: No results for input(s): "AST", "ALT", "ALKPHOS", "BILITOT", "PROT", "ALBUMIN" in the last 168 hours. No results for input(s): "LIPASE", "AMYLASE" in the last 168 hours. No results for  input(s): "AMMONIA" in the last 168 hours. CBC: Recent Labs  Lab 04/08/22 1415  WBC 7.8  HGB 11.5*  HCT 36.7  MCV 82.3  PLT 226   Cardiac Enzymes: No results for input(s): "CKTOTAL", "CKMB", "CKMBINDEX", "TROPONINI" in the last 168 hours.  BNP (last 3 results) No results for input(s): "BNP" in the last 8760 hours.  ProBNP (last 3 results) No results for input(s): "PROBNP" in the last 8760 hours.  CBG: No results for input(s): "GLUCAP" in the last 168 hours.  Radiological Exams on Admission: DG Chest 2 View  Result Date: 04/08/2022 CLINICAL DATA:  Chest pain. EXAM: CHEST - 2 VIEW COMPARISON:  None. FINDINGS: The heart size and mediastinal contours are within normal limits. Both lungs are clear. The visualized skeletal structures are unremarkable. IMPRESSION: No active cardiopulmonary disease. Electronically Signed   By: Marijo Conception M.D.   On: 04/08/2022 14:43    EKG: I independently viewed the EKG done and my findings are as followed: Normal sinus rhythm rate of 71.  QTc 406.  Assessment/Plan Present on Admission:  Chest pain  Principal Problem:   Chest pain  Atypical chest pain, rule out ACS. Intermittent exertional chest pain, that progressed to nonexertional chest pain.  Associated with shortness of breath and lightheadedness. Received full dose aspirin 324 mg x 1 in the ED. Heart score of 2 First set of high-sensitivity troponin negative, twelve-lead EKG with no evidence of acute ischemia. Chest pain persists in the ED, not responsive to nitroglycerin. Cycle troponin, cycle 12-lead EKG. Obtain D-dimer, if positive obtain CT angio to rule out PE. Fasting lipid panel Monitor on telemetry 2D echo UDS pending  History of COVID-19 viral infection Managed outpatient on 02/25/2022.  History of mechanical fall on 06/06/2021 with concussion. No apparent acute issues    DVT prophylaxis: Subcu Lovenox daily  Code Status: Full code  Family Communication: Updated  her husband at bedside  Disposition Plan: Admitted to telemetry cardiac unit  Consults called: Cardiology consulted by EDP.  Admission status: Observation status.   Status is: Observation    [name] MD [facility] Pager [phone]  If 7PM-7AM, please contact night-coverage [email or web address] Password [facility]  04/08/2022, 7:32 PM

## 2022-04-08 NOTE — ED Provider Notes (Signed)
[facility] Provider Note    Event Date/Time   First MD Initiated Contact with Patient 04/08/22 1648     (approximate)   History   Chief Complaint: Chest Pain  Husband at bedside acting as Spanish interpreter for the patient at her request.  I offered to use available hospital interpreter which she declines.  HPI  Kathleen Knight is a 46 y.o. female with a past history of migraines who comes to the ED due to chest pain.  Chest pain started this morning at about 11:00 AM while walking.  It has been constant all day, worse with exertion, better with rest.  Associate with shortness of breath and diaphoresis.  Nonradiating.  No vomiting.  At onset she also felt lightheaded but did not pass out or fall down.  Denies palpitations/racing heartbeat.  No vomiting or diarrhea.  Normal oral intake.     Physical Exam   Triage Vital Signs: ED Triage Vitals  Enc Vitals Group     BP 04/08/22 1414 121/69     Pulse Rate 04/08/22 1414 72     Resp 04/08/22 1414 18     Temp 04/08/22 1414 98.3 F (36.8 C)     Temp Source 04/08/22 1414 Oral     SpO2 04/08/22 1414 99 %     Weight 04/08/22 1413 138 lb 0.1 oz (62.6 kg)     Height 04/08/22 1413 5\' 2"  (1.575 m)     Head Circumference --      Peak Flow --      Pain Score 04/08/22 1412 5     Pain Loc --      Pain Edu? --      Excl. in [place]? --     Most recent vital signs: Vitals:   04/08/22 1414 04/08/22 1920  BP: 121/69 108/64  Pulse: 72 74  Resp: 18 16  Temp: 98.3 F (36.8 C) (!) 97.4 F (36.3 C)  SpO2: 99% 97%    General: Awake, no distress.  Appears calm CV:  Good peripheral perfusion.  Regular rate and rhythm.  Symmetric distal pulses Resp:  Normal effort.  Clear to auscultation bilaterally Abd:  No distention.  Soft nontender Other:  No oropharyngeal erythema.  Chest wall nontender, pain not reproducible   ED Results / Procedures / Treatments   Labs (all labs ordered are listed, but only abnormal  results are displayed) Labs Reviewed  BASIC METABOLIC PANEL - Abnormal; Notable for the following components:      Result Value   Glucose, Bld 107 (*)    All other components within normal limits  CBC - Abnormal; Notable for the following components:   Hemoglobin 11.5 (*)    MCH 25.8 (*)    RDW 19.0 (*)    All other components within normal limits  HIV ANTIBODY (ROUTINE TESTING W REFLEX)  CBC  CREATININE, SERUM  POC URINE PREG, ED  TROPONIN I (HIGH SENSITIVITY)  TROPONIN I (HIGH SENSITIVITY)     EKG Interpreted by me Normal sinus rhythm rate of 71.  Normal axis intervals QRS ST segments and T waves   RADIOLOGY Chest x-ray interpreted by me, appears normal.  Radiology report reviewed   PROCEDURES:  Procedures   MEDICATIONS ORDERED IN ED: Medications  nitroGLYCERIN (NITROSTAT) SL tablet 0.4 mg (0.4 mg Sublingual Given 04/08/22 1922)  enoxaparin (LOVENOX) injection 40 mg (has no administration in time range)  aspirin chewable tablet 324 mg (324 mg Oral Given 04/08/22 1735)  IMPRESSION / MDM / ASSESSMENT AND PLAN / ED COURSE  I reviewed the triage vital signs and the nursing notes.                              Differential diagnosis includes, but is not limited to, GERD, non-STEMI, pneumothorax, pneumonia, pleural effusion, pulmonary edema  Patient's presentation is most consistent with acute presentation with potential threat to life or bodily function.  Patient presents with acute chest pain syndrome.  HPI is worrisome for cardiac cause though she has no known risk factors.  Heart score is 3.  Vital signs, EKG, chest x-ray, initial troponin and other labs are all normal.  However, with exertional symptoms, unremitting pain, and like to do further observation with telemetry and cardiac work-up.  Discussed with cardiology Dr. Davina Poke who again emphasizes that this is unlikely to be cardiac but with ongoing pain, observation and trending troponins is reasonable, [facility]  cardiology whom she has seen in the past can evaluate patient in the morning.  Discussed with hospitalist for further management.       FINAL CLINICAL IMPRESSION(S) / ED DIAGNOSES   Final diagnoses:  Precordial pain     Rx / DC Orders   ED Discharge Orders     None        Note:  This document was prepared using Dragon voice recognition software and may include unintentional dictation errors.   Carrie Mew, MD 04/08/22 [phone]

## 2022-04-08 NOTE — ED Triage Notes (Signed)
Pt here with cp that started this morning. Pt states pain is centered and radiates to her back. Pt denies N/V/D but states she felt like she was going to pass out.

## 2022-04-09 ENCOUNTER — Observation Stay
Admit: 2022-04-09 | Discharge: 2022-04-09 | Disposition: A | Discharge status: Home / Self Care | Payer: 59 | Attending: Internal Medicine | Admitting: Internal Medicine

## 2022-04-09 DIAGNOSIS — R0789 Other chest pain: Secondary | ICD-10-CM | POA: Diagnosis not present

## 2022-04-09 DIAGNOSIS — I200 Unstable angina: Secondary | ICD-10-CM | POA: Diagnosis not present

## 2022-04-09 LAB — BASIC METABOLIC PANEL
Anion gap: 7 (ref 5–15)
BUN: 8 mg/dL (ref 6–20)
CO2: 22 mmol/L (ref 22–32)
Calcium: 8.4 mg/dL — ABNORMAL LOW (ref 8.9–10.3)
Chloride: 110 mmol/L (ref 98–111)
Creatinine, Ser: 0.53 mg/dL (ref 0.44–1.00)
GFR, Estimated: >60 mL/min (ref >60)
Glucose, Bld: 94 mg/dL (ref 70–99)
Potassium: 3.6 mmol/L (ref 3.5–5.1)
Sodium: 139 mmol/L (ref 135–145)

## 2022-04-09 LAB — URINE DRUG SCREEN, QUALITATIVE (ARMC ONLY)
Amphetamines, Ur Screen: NOT DETECTED
Barbiturates, Ur Screen: NOT DETECTED
Benzodiazepine, Ur Scrn: NOT DETECTED
Cannabinoid 50 Ng, Ur ~~LOC~~: NOT DETECTED
Cocaine Metabolite,Ur ~~LOC~~: NOT DETECTED
MDMA (Ecstasy)Ur Screen: NOT DETECTED
Methadone Scn, Ur: NOT DETECTED
Opiate, Ur Screen: NOT DETECTED
Phencyclidine (PCP) Ur S: NOT DETECTED
Tricyclic, Ur Screen: NOT DETECTED

## 2022-04-09 LAB — ECHOCARDIOGRAM COMPLETE
AR max vel: 1.81 cm2
AV Area VTI: 1.87 cm2
AV Area mean vel: 1.81 cm2
AV Mean grad: 3.7 mmHg
AV Peak grad: 6.7 mmHg
Ao pk vel: 1.29 m/s
Area-P 1/2: 3.27 cm2
Height: 62 in
S' Lateral: 2.6 cm
Weight: 2204.6 oz

## 2022-04-09 LAB — TSH: TSH: 2.989 u[IU]/mL (ref 0.350–4.500)

## 2022-04-09 LAB — LIPID PANEL
Cholesterol: 156 mg/dL (ref 0–200)
HDL: 49 mg/dL (ref >40)
LDL Cholesterol: 77 mg/dL (ref 0–99)
Total CHOL/HDL Ratio: 3.2 RATIO
Triglycerides: 150 mg/dL — ABNORMAL HIGH (ref <150)
VLDL: 30 mg/dL (ref 0–40)

## 2022-04-09 LAB — CBC
HCT: 35.6 % — ABNORMAL LOW (ref 36.0–46.0)
Hemoglobin: 11.1 g/dL — ABNORMAL LOW (ref 12.0–15.0)
MCH: 25.6 pg — ABNORMAL LOW (ref 26.0–34.0)
MCHC: 31.2 g/dL (ref 30.0–36.0)
MCV: 82.2 fL (ref 80.0–100.0)
Platelets: 201 10*3/uL (ref 150–400)
RBC: 4.33 MIL/uL (ref 3.87–5.11)
RDW: 19 % — ABNORMAL HIGH (ref 11.5–15.5)
WBC: 7.2 10*3/uL (ref 4.0–10.5)
nRBC: 0 % (ref 0.0–0.2)

## 2022-04-09 LAB — HIV ANTIBODY (ROUTINE TESTING W REFLEX): HIV Screen 4th Generation wRfx: NONREACTIVE

## 2022-04-09 LAB — MAGNESIUM: Magnesium: 2 mg/dL (ref 1.7–2.4)

## 2022-04-09 LAB — TROPONIN I (HIGH SENSITIVITY)
Troponin I (High Sensitivity): <2 ng/L (ref <18)
Troponin I (High Sensitivity): <2 ng/L (ref <18)

## 2022-04-09 LAB — PHOSPHORUS: Phosphorus: 3.3 mg/dL (ref 2.5–4.6)

## 2022-04-09 LAB — D-DIMER, QUANTITATIVE: D-Dimer, Quant: 0.41 ug/mL-FEU (ref 0.00–0.50)

## 2022-04-09 NOTE — Consult Note (Addendum)
[facility] NOTE       Patient ID: Kathleen Knight MRN: [id number] ZC:[id number] DOB/AGE: [birth year] 46 y.o.  Admit date: 04/08/2022 Referring Physician Dr. Joni Fears Primary Physician Princella Ion  Primary Cardiologist Dr. Ubaldo Glassing (seen once in 2021)  Reason for Consultation chest pain  HPI: Kathleen Knight is a 46 year old Spanish-speaking female with a past medical history of migraines who presented to the ED 04/09/2022 with sharp chest pain.  Cardiology is consulted for further assistance.  She presents with her husband who contributes to the history which is obtained via a video Spanish interpreter.  She says around 1030 yesterday morning 9/19 she started having a stabbing pain in her chest while she was walking, she points to her lower sternum/left lower sternal border almost at the lower inner quadrant of her left breast as where the pain was occurring.  It did not radiate and was not associated with nausea or diaphoresis.  Nonpleuritic.  She cannot really point to anything that makes it worse, per documentation of ED and admitting providers it was worse with exertion, however she denies this to me.  Around 1 or 130 she had a transient episode of lightheadedness and dizziness which was short-lived, but she felt likes she needed to sit down and get help, so she came to the ED. the stabbing pain in her chest lasted all afternoon until she presented to the ED and received SL nitro x2 and had some discomfort earlier this morning but none at my time of evaluation.  She denies ever having this pain before, she ate breakfast and drink plenty of water yesterday morning and has otherwise been in her usual state of health.  She was diagnosed with COVID for the third time 1 month ago but has since recovered from this.  She does not smoke, does not drink alcohol or use drugs. 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 She saw Dr. Ubaldo Glassing once in 2021 for tachycardia in the setting of  COVID-pneumonia with atypical chest pain and shortness of breath for which as needed follow-up was recommended.  Vitals are notable for a blood pressure of 114/63, heart rate 64 in sinus rhythm on telemetry.  She is comfortable on room air.  Labs are notable for potassium 3.6, BUN/creatinine 8/0.53 magnesium 2.0.  Troponins x4 are negative each measuring less than 2.  TC 156, HDL 49, LDL 47, triglycerides 150.  No leukocytosis with WBCs of 7.2.  H&H stable at 11.1/35.6, platelets 201.  TSH 2.9, UDS negative.  Chest x-ray negative for active cardiopulmonary disease.  Review of systems complete and found to be negative unless listed above     History reviewed. No pertinent past medical history.  History reviewed. No pertinent surgical history.  Medications Prior to Admission  Medication Sig Dispense Refill Last Dose   cetirizine (ZYRTEC) 10 MG tablet Take 10 mg by mouth daily. (Patient not taking: Reported on 04/08/2022)   Not Taking   ibuprofen (ADVIL) 600 MG tablet Take 1 tablet by mouth three times daily as needed with meals for headache (Patient not taking: Reported on 04/08/2022) 30 tablet 0 Not Taking   Lidocaine, Anorectal, 5 % CREA Apply 1 application topically QID. (Patient not taking: Reported on 04/08/2022) 45 g 0 Not Taking   ondansetron (ZOFRAN ODT) 4 MG disintegrating tablet Allow 1-2 tablets to dissolve in your mouth every 8 hours as needed for nausea/vomiting (Patient not taking: Reported on 04/08/2022) 30 tablet 0 Not Taking   Social History  Socioeconomic History   Marital status: Single    Spouse name: Not on file   Number of children: Not on file   Years of education: Not on file   Highest education level: Not on file  Occupational History   Not on file  Tobacco Use   Smoking status: Never   Smokeless tobacco: Never  Substance and Sexual Activity   Alcohol use: Never   Drug use: Never   Sexual activity: Not on file  Other Topics Concern   Not on file  Social History  Narrative   Not on file   Social Determinants of Health   Financial Resource Strain: Not on file  Food Insecurity: No Food Insecurity (04/09/2022)   Hunger Vital Sign    Worried About Running Out of Food in the Last Year: Never true    Ran Out of Food in the Last Year: Never true  Transportation Needs: No Transportation Needs (04/09/2022)   PRAPARE - Hydrologist (Medical): No    Lack of Transportation (Non-Medical): No  Physical Activity: Not on file  Stress: Not on file  Social Connections: Not on file  Intimate Partner Violence: Not At Risk (04/09/2022)   Humiliation, Afraid, Rape, and Kick questionnaire    Fear of Current or Ex-Partner: No    Emotionally Abused: No    Physically Abused: No    Sexually Abused: No    Family History  Problem Relation Age of Onset   Breast cancer Sister 14      PHYSICAL EXAM General: Pleasant well-appearing middle-aged Hispanic female, well nourished, in no acute distress.  Laying in incline in hospital bed with her spouse at bedside HEENT:  Normocephalic and atraumatic. Neck:  No JVD.  Lungs: Normal respiratory effort on room air. Clear bilaterally to auscultation. No wheezes, crackles, rhonchi.  Chest: Nontender to palpation of the anterior chest wall and along the sternum, mild tenderness to palpation of her left lower inner quadrant of the Left breast, but different in character to the stabbing pain she was having Heart: HRRR . Normal S1 and S2 without gallops or murmurs.  Abdomen: Non-distended appearing.  Msk: Normal strength and tone for age. Extremities: Warm and well perfused. No clubbing, cyanosis.  No peripheral edema.  Neuro: Alert and oriented X 3. Psych:  Answers questions appropriately.   Labs: Basic Metabolic Panel: Recent Labs    04/08/22 1415 04/09/22 0319  NA 135 139  K 3.9 3.6  CL 104 110  CO2 26 22  GLUCOSE 107* 94  BUN 9 8  CREATININE 0.57 0.53  CALCIUM 9.4 8.4*  MG  --  2.0   PHOS  --  3.3   Liver Function Tests: No results for input(s): "AST", "ALT", "ALKPHOS", "BILITOT", "PROT", "ALBUMIN" in the last 72 hours. No results for input(s): "LIPASE", "AMYLASE" in the last 72 hours. CBC: Recent Labs    04/08/22 1415 04/09/22 0319  WBC 7.8 7.2  HGB 11.5* 11.1*  HCT 36.7 35.6*  MCV 82.3 82.2  PLT 226 201   Cardiac Enzymes: Recent Labs    04/08/22 1921 04/09/22 0036 04/09/22 0319  TROPONINIHS <2 <2 <2   BNP: Invalid input(s): "POCBNP" D-Dimer: Recent Labs    04/09/22 0036  DDIMER 0.41   Hemoglobin A1C: No results for input(s): "HGBA1C" in the last 72 hours. Fasting Lipid Panel: Recent Labs    04/09/22 0319  CHOL 156  HDL 49  LDLCALC 77  TRIG 150*  CHOLHDL 3.2  Thyroid Function Tests: Recent Labs    04/09/22 0319  TSH 2.989   Anemia Panel: No results for input(s): "VITAMINB12", "FOLATE", "FERRITIN", "TIBC", "IRON", "RETICCTPCT" in the last 72 hours.  DG Chest 2 View  Result Date: 04/08/2022 CLINICAL DATA:  Chest pain. EXAM: CHEST - 2 VIEW COMPARISON:  None. FINDINGS: The heart size and mediastinal contours are within normal limits. Both lungs are clear. The visualized skeletal structures are unremarkable. IMPRESSION: No active cardiopulmonary disease. Electronically Signed   By: Marijo Conception M.D.   On: 04/08/2022 14:43     Radiology: DG Chest 2 View  Result Date: 04/08/2022 CLINICAL DATA:  Chest pain. EXAM: CHEST - 2 VIEW COMPARISON:  None. FINDINGS: The heart size and mediastinal contours are within normal limits. Both lungs are clear. The visualized skeletal structures are unremarkable. IMPRESSION: No active cardiopulmonary disease. Electronically Signed   By: Marijo Conception M.D.   On: 04/08/2022 14:43    ECHO pending formal read  TELEMETRY reviewed by me (LT) 04/09/2022 : Sinus rhythm rate 60s to 70s  EKG reviewed by me: Sinus rhythm incomplete RBBB rate 71, no significant change from prior from 05/2021  Data reviewed by  me (LT) 04/09/2022: Admission H&P, ED provider note, CBC, BMP, TSH, chest x-ray, last cardiology note from 2021 telemetry, vitals  ASSESSMENT AND PLAN:  Pasty Lipford is a 46 year old Spanish-speaking female with a past medical history of migraines who presented to the ED 04/09/2022 with sharp chest pain.  Cardiology is consulted for further assistance.  #Atypical chest pain vs ?left breast pain Presents with stabbing chest pain that is worst at left lower sternal border/lower inner quadrant of her left breast associated with some transient dizziness and lightheadedness but otherwise is nonexertional, not reproducible or pleuritic, not associated with nausea or diaphoresis, and was somewhat relieved with nitroglycerin.  Her EKG is nonischemic but does show a incomplete RBBB, overall unchanged from 05/2021, troponins on 4 repeats are negative at less than 2, highly suggestive of a noncardiac cause.  D-dimer negative, chest x-ray without acute abnormality.  She does not have many risk factors for heart disease and does not have a family history. -S/p 325 mg aspirin -Defer heparin drip -Recommended heart healthy diet  -Echocardiogram complete, if without significant abnormality, okay for discharge today from a cardiac standpoint, recommend follow-up with her PCP for further work-up of noncardiac causes of her discomfort, including left breast pain  This patient's plan of care was discussed and created with Dr. Nehemiah Massed and he is in agreement.  Signed: Tristan Schroeder , PA-C 04/09/2022, 9:45 AM [facility]  The patient has very atypical chest discomfort.  Suggesting no evidence of cardiac source of chest pain.  The patient has slightly abnormal EKG with incomplete right bundle branch block but no other cardiovascular concerns at this time.  Echocardiogram shows normal LV systolic function and no evidence of valvular heart disease.  Therefore no further intervention at this time from  the cardiovascular standpoint.  I have personally reviewed EKG, chest x-ray, telemetry, echocardiogram, laboratory work  The patient has been interviewed and examined. I agree with assessment and plan above. Serafina Royals MD [facility]

## 2022-04-09 NOTE — Progress Notes (Signed)
*  PRELIMINARY RESULTS* Echocardiogram 2D Echocardiogram has been performed.  Sherrie Sport 04/09/2022, 9:25 AM

## 2022-04-09 NOTE — Progress Notes (Signed)
Patient alert and oriented, vss, no complaints of pain.  Patient to be escorted out of hospital via wheelchair by volunteers.  D/c telemetry and piv.

## 2022-04-09 NOTE — TOC CM/SW Note (Signed)
Patient has orders to discharge home today. Chart reviewed. No TOC needs identified. CSW signing off.  Ambrosio Reuter, CSW [phone]  

## 2022-04-09 NOTE — Discharge Summary (Signed)
Physician Discharge Summary   Patient: Kathleen Knight MRN: [id number] DOB: [birth year]  Admit date:     04/08/2022  Discharge date: 04/09/22  Discharge Physician: Ezekiel Slocumb   PCP: Denton Lank, MD   Recommendations at discharge:    Follow up with Primary Care in 1-2 weeks Please ensure screening mammogram gets scheduled  Repeat BMP, CBC in 1-2 weeks  Discharge Diagnoses: Principal Problem:   Chest pain  Resolved Problems:   * No resolved hospital problems. [facility] Course: HPI on admission:  "Kathleen Knight is a 46 y.o. female with no significant past medical history, not on any prescribed medication, who presented to [facility] ED with complaints of intermittent exertional chest pain, improved with rest and worsened with ambulation.  Onset this morning around 10 AM, the chest pain progressed to nonexertional.  She describes the pain as sharp, centrally located.  5 out of 10, nonradiating.  Nonpleuritic.  Associated with shortness of breath and lightheadedness.  No family history of heart disease.  No use of tobacco, alcohol, or illicit drugs.  Denies lower extremity edema or pain.  No lengthy trips.  Recent diagnosis of COVID-19 infection in February 25, 2022 for which she was managed outpatient.     In the ED, the patient's chest pain persists even at rest.  She received a dose of nitroglycerin with no improvement.  Heart score of 2.  First set of high-sensitivity troponin negative.  Twelve-lead EKG no evidence of acute ischemia.  EDP requested admission for further cardiac work-up.  EDP will consult cardiology.  TR[facility], hospitalist service, was asked to admit.   ED Course: Tmax 98.3.  BP 108/64, pulse 74, respiratory 16, O2 saturation 97% on room air.  High-sensitivity troponin negative x1.  Repeat troponin in process.  CBC and BMP essentially unremarkable."   Troponin was trended and remained < 2 on three occasions.  Echocardiogram was obtained that showed normal LV EF 89-16%,  normal diastolic parameters, no valvular disease. Lipid profile showed triglycerides of 151.  Patient was seen by Cardiology and no further cardiac evaluation was recommended. Cardiac etiology for chest pain ruled out.    9/20: Patient is without chest pain this afternoon, clinically improved and medically stable for discharge home today.  Patient and husband at bedside updated on test results and agreeable with plan.    Assessment and Plan:  Atypical Chest pain - POA, ACS ruled out. Pain has resolved. Work up as outlined above. Primary Care follow up in 1-2 weeks recommended. Suspect etiology is musculoskeletal in nature vs less likely esophageal (as CP is left-sided).  Hypertriglyceridema - will defer to PCP on starting medication vs lifestyle changes and repeat lipid profile in 3-6 months.      Consultants: Cardiology Procedures performed: Echocardiogram  Disposition: Home Diet recommendation:  Discharge Diet Orders (From admission, onward)     Start     Ordered   04/09/22 0000  Diet - low sodium heart healthy        04/09/22 1322           Cardiac diet DISCHARGE MEDICATION: Allergies as of 04/09/2022   No Known Allergies      Medication List     STOP taking these medications    cetirizine 10 MG tablet Commonly known as: ZYRTEC   ibuprofen 600 MG tablet Commonly known as: ADVIL   Lidocaine (Anorectal) 5 % Crea   ondansetron 4 MG disintegrating tablet Commonly known as: Zofran ODT  Discharge Exam: Filed Weights   04/08/22 1413 04/08/22 2240  Weight: 62.6 kg 62.5 kg   General exam: awake, alert, no acute distress HEENT: atraumatic, clear conjunctiva, anicteric sclera, moist mucus membranes, hearing grossly normal  Respiratory system: CTAB, no wheezes, rales or rhonchi, normal respiratory effort. Cardiovascular system: normal S1/S2, RRR, no JVD, murmurs, rubs, gallops, no pedal edema.   Gastrointestinal system: soft, NT, ND, no HSM  felt, +bowel sounds. Central nervous system: A&O x3. no gross focal neurologic deficits, normal speech Extremities: moves all, no edema, normal tone Skin: dry, intact, normal temperature Psychiatry: normal mood, congruent affect   Condition at discharge: stable  The results of significant diagnostics from this hospitalization (including imaging, microbiology, ancillary and laboratory) are listed below for reference.   Imaging Studies: ECHOCARDIOGRAM COMPLETE  Result Date: 04/09/2022    ECHOCARDIOGRAM REPORT   Patient Name:   Kathleen Knight Date of Exam: 04/09/2022 Medical Rec #:  [id number]       Height:       62.0 in Accession #:    [id number]      Weight:       137.8 lb Date of Birth:  1976-02-19      BSA:          1.632 m Patient Age:    45 years        BP:           114/63 mmHg Patient Gender: F               HR:           64 bpm. Exam Location:  [facility] Procedure: 2D Echo, Cardiac Doppler and Color Doppler Indications:     Chest pain R07.9  History:         Patient has no prior history of Echocardiogram examinations. No                  past medical history on file.  Sonographer:     Cristela Blue Referring Phys:  [id number] Oliver Pila HALL Diagnosing Phys: Arnoldo Hooker MD IMPRESSIONS  1. Left ventricular ejection fraction, by estimation, is 60 to 65%. The left ventricle has normal function. The left ventricle has no regional wall motion abnormalities. Left ventricular diastolic parameters were normal.  2. Right ventricular systolic function is normal. The right ventricular size is normal.  3. The mitral valve is normal in structure. Trivial mitral valve regurgitation.  4. The aortic valve is normal in structure. Aortic valve regurgitation is not visualized. FINDINGS  Left Ventricle: Left ventricular ejection fraction, by estimation, is 60 to 65%. The left ventricle has normal function. The left ventricle has no regional wall motion abnormalities. The left ventricular internal cavity size was normal in  size. There is  no left ventricular hypertrophy. Left ventricular diastolic parameters were normal. Right Ventricle: The right ventricular size is normal. No increase in right ventricular wall thickness. Right ventricular systolic function is normal. Left Atrium: Left atrial size was normal in size. Right Atrium: Right atrial size was normal in size. Pericardium: There is no evidence of pericardial effusion. Mitral Valve: The mitral valve is normal in structure. Trivial mitral valve regurgitation. Tricuspid Valve: The tricuspid valve is normal in structure. Tricuspid valve regurgitation is trivial. Aortic Valve: The aortic valve is normal in structure. Aortic valve regurgitation is not visualized. Aortic valve mean gradient measures 3.7 mmHg. Aortic valve peak gradient measures 6.7 mmHg. Aortic valve area, by VTI measures 1.87 cm. Pulmonic Valve: The  pulmonic valve was normal in structure. Pulmonic valve regurgitation is trivial. Aorta: The aortic root and ascending aorta are structurally normal, with no evidence of dilitation. IAS/Shunts: No atrial level shunt detected by color flow Doppler.  LEFT VENTRICLE PLAX 2D LVIDd:         3.80 cm   Diastology LVIDs:         2.60 cm   LV e' medial:    13.40 cm/s LV PW:         0.80 cm   LV E/e' medial:  6.3 LV IVS:        0.70 cm   LV e' lateral:   15.00 cm/s LVOT diam:     2.00 cm   LV E/e' lateral: 5.6 LV SV:         53 LV SV Index:   33 LVOT Area:     3.14 cm  RIGHT VENTRICLE RV Basal diam:  2.80 cm RV Mid diam:    2.40 cm LEFT ATRIUM             Index        RIGHT ATRIUM           Index LA diam:        2.50 cm 1.53 cm/m   RA Area:     10.80 cm LA Vol (A2C):   26.8 ml 16.42 ml/m  RA Volume:   20.90 ml  12.81 ml/m LA Vol (A4C):   17.8 ml 10.91 ml/m LA Biplane Vol: 23.0 ml 14.10 ml/m  AORTIC VALVE AV Area (Vmax):    1.81 cm AV Area (Vmean):   1.81 cm AV Area (VTI):     1.87 cm AV Vmax:           129.00 cm/s AV Vmean:          86.600 cm/s AV VTI:            0.283  m AV Peak Grad:      6.7 mmHg AV Mean Grad:      3.7 mmHg LVOT Vmax:         74.40 cm/s LVOT Vmean:        49.800 cm/s LVOT VTI:          0.169 m LVOT/AV VTI ratio: 0.60  AORTA Ao Root diam: 3.00 cm MITRAL VALVE               TRICUSPID VALVE MV Area (PHT): 3.27 cm    TR Peak grad:   15.5 mmHg MV Decel Time: 232 msec    TR Vmax:        197.00 cm/s MV E velocity: 84.40 cm/s MV A velocity: 61.80 cm/s  SHUNTS MV E/A ratio:  1.37        Systemic VTI:  0.17 m                            Systemic Diam: 2.00 cm Arnoldo Hooker MD Electronically signed by Arnoldo Hooker MD Signature Date/Time: 04/09/2022/12:18:46 PM    Final    DG Chest 2 View  Result Date: 04/08/2022 CLINICAL DATA:  Chest pain. EXAM: CHEST - 2 VIEW COMPARISON:  None. FINDINGS: The heart size and mediastinal contours are within normal limits. Both lungs are clear. The visualized skeletal structures are unremarkable. IMPRESSION: No active cardiopulmonary disease. Electronically Signed   By: Lupita Raider M.D.   On: 04/08/2022 14:43    Microbiology: No  results found for this or any previous visit.  Labs: CBC: Recent Labs  Lab 04/08/22 1415 04/09/22 0319  WBC 7.8 7.2  HGB 11.5* 11.1*  HCT 36.7 35.6*  MCV 82.3 82.2  PLT 226 201   Basic Metabolic Panel: Recent Labs  Lab 04/08/22 1415 04/09/22 0319  NA 135 139  K 3.9 3.6  CL 104 110  CO2 26 22  GLUCOSE 107* 94  BUN 9 8  CREATININE 0.57 0.53  CALCIUM 9.4 8.4*  MG  --  2.0  PHOS  --  3.3   Liver Function Tests: No results for input(s): "AST", "ALT", "ALKPHOS", "BILITOT", "PROT", "ALBUMIN" in the last 168 hours. CBG: No results for input(s): "GLUCAP" in the last 168 hours.  Discharge time spent: less than 30 minutes.  Signed: Pennie BanterKelly A Carli Lefevers, DO [facility] 04/09/2022

## 2022-04-18 ENCOUNTER — Other Ambulatory Visit: Payer: Self-pay | Admitting: Family Medicine

## 2022-04-18 DIAGNOSIS — R921 Mammographic calcification found on diagnostic imaging of breast: Secondary | ICD-10-CM

## 2022-05-21 ENCOUNTER — Ambulatory Visit
Admission: RE | Admit: 2022-05-21 | Discharge: 2022-05-21 | Disposition: A | Discharge status: Home / Self Care | Payer: 59 | Source: Ambulatory Visit | Attending: Family Medicine | Admitting: Family Medicine

## 2022-05-21 DIAGNOSIS — R921 Mammographic calcification found on diagnostic imaging of breast: Secondary | ICD-10-CM | POA: Insufficient documentation

## 2022-05-21 DIAGNOSIS — R92323 Mammographic fibroglandular density, bilateral breasts: Secondary | ICD-10-CM | POA: Diagnosis not present

## 2023-01-14 DIAGNOSIS — G43009 Migraine without aura, not intractable, without status migrainosus: Secondary | ICD-10-CM | POA: Diagnosis not present

## 2023-01-14 DIAGNOSIS — H53149 Visual discomfort, unspecified: Secondary | ICD-10-CM | POA: Diagnosis not present

## 2023-01-14 DIAGNOSIS — R110 Nausea: Secondary | ICD-10-CM | POA: Diagnosis not present

## 2023-01-14 DIAGNOSIS — M542 Cervicalgia: Secondary | ICD-10-CM | POA: Diagnosis not present

## 2023-05-25 ENCOUNTER — Other Ambulatory Visit: Payer: Self-pay | Admitting: Family Medicine

## 2023-05-25 DIAGNOSIS — Z1231 Encounter for screening mammogram for malignant neoplasm of breast: Secondary | ICD-10-CM

## 2023-06-02 DIAGNOSIS — G43009 Migraine without aura, not intractable, without status migrainosus: Secondary | ICD-10-CM | POA: Diagnosis not present

## 2023-06-02 DIAGNOSIS — R110 Nausea: Secondary | ICD-10-CM | POA: Diagnosis not present

## 2023-06-02 DIAGNOSIS — H53149 Visual discomfort, unspecified: Secondary | ICD-10-CM | POA: Diagnosis not present

## 2023-06-02 DIAGNOSIS — M542 Cervicalgia: Secondary | ICD-10-CM | POA: Diagnosis not present

## 2023-06-02 DIAGNOSIS — G4483 Primary cough headache: Secondary | ICD-10-CM | POA: Diagnosis not present

## 2023-06-03 ENCOUNTER — Other Ambulatory Visit: Payer: Self-pay | Admitting: Physician Assistant

## 2023-06-03 DIAGNOSIS — G4484 Primary exertional headache: Secondary | ICD-10-CM

## 2023-06-10 ENCOUNTER — Ambulatory Visit
Admission: RE | Admit: 2023-06-10 | Discharge: 2023-06-10 | Disposition: A | Discharge status: Home / Self Care | Payer: 59 | Source: Ambulatory Visit | Attending: Family Medicine | Admitting: Family Medicine

## 2023-06-10 DIAGNOSIS — Z1231 Encounter for screening mammogram for malignant neoplasm of breast: Secondary | ICD-10-CM | POA: Insufficient documentation

## 2023-06-12 ENCOUNTER — Encounter: Payer: Self-pay | Admitting: Family Medicine

## 2023-06-17 ENCOUNTER — Other Ambulatory Visit: Payer: Self-pay | Admitting: Family Medicine

## 2023-06-17 DIAGNOSIS — R928 Other abnormal and inconclusive findings on diagnostic imaging of breast: Secondary | ICD-10-CM

## 2023-06-22 ENCOUNTER — Ambulatory Visit
Admission: RE | Admit: 2023-06-22 | Discharge: 2023-06-22 | Disposition: A | Discharge status: Home / Self Care | Payer: 59 | Source: Ambulatory Visit | Attending: Family Medicine | Admitting: Family Medicine

## 2023-06-22 DIAGNOSIS — R928 Other abnormal and inconclusive findings on diagnostic imaging of breast: Secondary | ICD-10-CM | POA: Insufficient documentation

## 2023-06-22 DIAGNOSIS — N6315 Unspecified lump in the right breast, overlapping quadrants: Secondary | ICD-10-CM | POA: Diagnosis not present

## 2023-06-22 DIAGNOSIS — R92333 Mammographic heterogeneous density, bilateral breasts: Secondary | ICD-10-CM | POA: Diagnosis not present

## 2023-07-01 ENCOUNTER — Encounter: Payer: Self-pay | Admitting: Physician Assistant

## 2023-07-02 ENCOUNTER — Encounter: Payer: Self-pay | Admitting: Physician Assistant

## 2023-07-03 ENCOUNTER — Encounter: Payer: Self-pay | Admitting: Physician Assistant

## 2023-07-07 ENCOUNTER — Encounter: Payer: Self-pay | Admitting: Physician Assistant

## 2023-07-08 ENCOUNTER — Ambulatory Visit
Admission: RE | Admit: 2023-07-08 | Discharge: 2023-07-08 | Disposition: A | Discharge status: Home / Self Care | Payer: 59 | Source: Ambulatory Visit | Attending: Physician Assistant | Admitting: Physician Assistant

## 2023-07-08 DIAGNOSIS — G4484 Primary exertional headache: Secondary | ICD-10-CM

## 2023-07-08 MED ORDER — GADOPICLENOL 0.5 MMOL/ML IV SOLN
7.0000 mL | Freq: Once | INTRAVENOUS | Status: AC | PRN
  Administered 2023-07-08: 7 mL via INTRAVENOUS

## 2023-08-03 DIAGNOSIS — E236 Other disorders of pituitary gland: Secondary | ICD-10-CM | POA: Diagnosis not present

## 2023-08-03 DIAGNOSIS — M542 Cervicalgia: Secondary | ICD-10-CM | POA: Diagnosis not present

## 2023-08-03 DIAGNOSIS — G43009 Migraine without aura, not intractable, without status migrainosus: Secondary | ICD-10-CM | POA: Diagnosis not present

## 2023-08-03 DIAGNOSIS — R110 Nausea: Secondary | ICD-10-CM | POA: Diagnosis not present

## 2023-08-03 DIAGNOSIS — H53149 Visual discomfort, unspecified: Secondary | ICD-10-CM | POA: Diagnosis not present

## 2023-08-03 DIAGNOSIS — G4484 Primary exertional headache: Secondary | ICD-10-CM | POA: Diagnosis not present

## 2023-08-11 DIAGNOSIS — E236 Other disorders of pituitary gland: Secondary | ICD-10-CM | POA: Diagnosis not present

## 2023-08-11 DIAGNOSIS — H539 Unspecified visual disturbance: Secondary | ICD-10-CM | POA: Diagnosis not present

## 2023-09-17 DIAGNOSIS — R519 Headache, unspecified: Secondary | ICD-10-CM | POA: Diagnosis not present

## 2023-09-17 DIAGNOSIS — H524 Presbyopia: Secondary | ICD-10-CM | POA: Diagnosis not present

## 2023-09-17 DIAGNOSIS — E236 Other disorders of pituitary gland: Secondary | ICD-10-CM | POA: Diagnosis not present

## 2023-09-20 ENCOUNTER — Emergency Department
Admission: EM | Admit: 2023-09-20 | Discharge: 2023-09-20 | Disposition: A | Discharge status: Home / Self Care | Attending: Emergency Medicine | Admitting: Emergency Medicine

## 2023-09-20 ENCOUNTER — Other Ambulatory Visit: Payer: Self-pay

## 2023-09-20 DIAGNOSIS — G43809 Other migraine, not intractable, without status migrainosus: Secondary | ICD-10-CM | POA: Insufficient documentation

## 2023-09-20 DIAGNOSIS — R519 Headache, unspecified: Secondary | ICD-10-CM | POA: Diagnosis present

## 2023-09-20 DIAGNOSIS — R0789 Other chest pain: Secondary | ICD-10-CM | POA: Diagnosis not present

## 2023-09-20 LAB — CBC WITH DIFFERENTIAL/PLATELET
Abs Immature Granulocytes: 0.01 10*3/uL (ref 0.00–0.07)
Basophils Absolute: 0 10*3/uL (ref 0.0–0.1)
Basophils Relative: 0 %
Eosinophils Absolute: 0.1 10*3/uL (ref 0.0–0.5)
Eosinophils Relative: 1 %
HCT: 41.9 % (ref 36.0–46.0)
Hemoglobin: 14.4 g/dL (ref 12.0–15.0)
Immature Granulocytes: 0 %
Lymphocytes Relative: 23 %
Lymphs Abs: 1.7 10*3/uL (ref 0.7–4.0)
MCH: 32.4 pg (ref 26.0–34.0)
MCHC: 34.4 g/dL (ref 30.0–36.0)
MCV: 94.2 fL (ref 80.0–100.0)
Monocytes Absolute: 0.2 10*3/uL (ref 0.1–1.0)
Monocytes Relative: 3 %
Neutro Abs: 5.2 10*3/uL (ref 1.7–7.7)
Neutrophils Relative %: 73 %
Platelets: 204 10*3/uL (ref 150–400)
RBC: 4.45 MIL/uL (ref 3.87–5.11)
RDW: 13.1 % (ref 11.5–15.5)
WBC: 7.2 10*3/uL (ref 4.0–10.5)
nRBC: 0 % (ref 0.0–0.2)

## 2023-09-20 LAB — POC URINE PREG, ED: Preg Test, Ur: NEGATIVE

## 2023-09-20 LAB — BASIC METABOLIC PANEL
Anion gap: 7 (ref 5–15)
BUN: 8 mg/dL (ref 6–20)
CO2: 23 mmol/L (ref 22–32)
Calcium: 9.3 mg/dL (ref 8.9–10.3)
Chloride: 109 mmol/L (ref 98–111)
Creatinine, Ser: 0.57 mg/dL (ref 0.44–1.00)
GFR, Estimated: >60 mL/min (ref >60)
Glucose, Bld: 102 mg/dL — ABNORMAL HIGH (ref 70–99)
Potassium: 3.8 mmol/L (ref 3.5–5.1)
Sodium: 139 mmol/L (ref 135–145)

## 2023-09-20 LAB — TROPONIN I (HIGH SENSITIVITY): Troponin I (High Sensitivity): <2 ng/L (ref <18)

## 2023-09-20 MED ORDER — KETOROLAC TROMETHAMINE 10 MG PO TABS: 10.0000 mg | ORAL_TABLET | Freq: Four times a day (QID) | ORAL | 0 refills | Status: AC | PRN

## 2023-09-20 MED ORDER — KETOROLAC TROMETHAMINE 30 MG/ML IJ SOLN
30.0000 mg | Freq: Once | INTRAMUSCULAR | Status: AC
  Administered 2023-09-20: 30 mg via INTRAVENOUS
  Filled 2023-09-20: qty 1

## 2023-09-20 MED ORDER — PROCHLORPERAZINE MALEATE 10 MG PO TABS: 10.0000 mg | ORAL_TABLET | Freq: Four times a day (QID) | ORAL | 0 refills | Status: AC | PRN

## 2023-09-20 MED ORDER — PROCHLORPERAZINE EDISYLATE 10 MG/2ML IJ SOLN
10.0000 mg | Freq: Once | INTRAMUSCULAR | Status: AC
  Administered 2023-09-20: 10 mg via INTRAVENOUS
  Filled 2023-09-20: qty 2

## 2023-09-20 MED ORDER — SODIUM CHLORIDE 0.9 % IV BOLUS
1000.0000 mL | Freq: Once | INTRAVENOUS | Status: AC
  Administered 2023-09-20: 1000 mL via INTRAVENOUS

## 2023-09-20 NOTE — ED Provider Notes (Signed)
 [facility] Provider Note    Event Date/Time   First MD Initiated Contact with Patient 09/20/23 [phone]     (approximate)   History   Headache   HPI  Kathleen Knight is a 48 y.o. female  with history of migraine, pituitary cyst and as listed in EMR presents to the emergency department for treatment and evaluation of migraine for 3 days that is not improving with eletriptan. She reports headache is throbbing, persistent, frontal and behind both eyes. Typically it is frontal, but behind only one eye. She has had nausea and vomiting associated with the headache. She denies recent head injury or extremity weakness. This is not the worst headache she has ever had, but it has been the most resistant to her medications. She typically has at least 2 headache days per week.     Physical Exam   Triage Vital Signs: ED Triage Vitals  Encounter Vitals Group     BP 09/20/23 0922 133/75     Systolic BP Percentile --      Diastolic BP Percentile --      Pulse Rate 09/20/23 0922 63     Resp 09/20/23 0922 20     Temp 09/20/23 0922 97.9 F (36.6 C)     Temp Source 09/20/23 0922 Oral     SpO2 09/20/23 0922 96 %     Weight 09/20/23 0921 137 lb (62.1 kg)     Height 09/20/23 0921 5\' 2"  (1.575 m)     Head Circumference --      Peak Flow --      Pain Score 09/20/23 0921 9     Pain Loc --      Pain Education --      Exclude from Growth Chart --     Most recent vital signs: Vitals:   09/20/23 0922 09/20/23 1221  BP: 133/75 130/70  Pulse: 63 60  Resp: 20 18  Temp: 97.9 F (36.6 C)   SpO2: 96% 97%    General: Awake, no distress.  CV:  Good peripheral perfusion.  Resp:  Normal effort.  Abd:  No distention.  Other:  Pupils equal, round, reactive.   ED Results / Procedures / Treatments   Labs (all labs ordered are listed, but only abnormal results are displayed) Labs Reviewed  BASIC METABOLIC PANEL - Abnormal; Notable for the following components:      Result  Value   Glucose, Bld 102 (*)    All other components within normal limits  CBC WITH DIFFERENTIAL/PLATELET  POC URINE PREG, ED  TROPONIN I (HIGH SENSITIVITY)  TROPONIN I (HIGH SENSITIVITY)     EKG  Normal sinus rhythm with a rate of 62.   RADIOLOGY  Image and radiology report reviewed and interpreted by me. Radiology report consistent with the same.  Not indicated.  PROCEDURES:  Critical Care performed: No  Procedures   MEDICATIONS ORDERED IN ED:  Medications  ketorolac (TORADOL) 30 MG/ML injection 30 mg (30 mg Intravenous Given 09/20/23 1019)  sodium chloride 0.9 % bolus 1,000 mL (0 mLs Intravenous Stopped 09/20/23 1220)  prochlorperazine (COMPAZINE) injection 10 mg (10 mg Intravenous Given 09/20/23 1018)     IMPRESSION / MDM / ASSESSMENT AND PLAN / ED COURSE   I have reviewed the triage note.  Differential diagnosis includes, but is not limited to, Migraine, tension headache, sinusitis,   Patient's presentation is most consistent with acute illness / injury with system symptoms.  48 year old female presenting to the  emergency department for 3-day migraine headache that is not improved with her triptan.  She has also taken ibuprofen and Tylenol without relief.  Pain has been severe enough to cause her nausea and vomiting.  Headache is similar to her typical migraine with the exception that it is behind both eyes today instead of just 1.  She is followed by neurology at [facility].  Was apparently some mention of chest pain in triage, however patient states that her chest does not hurt except while actively vomiting.  Labs were collected including troponin which are all reassuring.  EKG shows a normal sinus rhythm.  Headache cocktail and IV fluids ordered.  Patient reports some relief.  She has about 500 mL IV fluids left to infuse.  She does not feel that she needs any additional medications at this time.  She was encouraged to alert staff if headache begins to get  worse again.  Plan will be to let the IV fluids finish and reassess her then.  IV fluids complete.  Patient reports resolution of her headache as well as the nausea and denies vomiting while here in the emergency department.  She would like to have similar medications at home that were used today.  I will send a prescription for ketorolac and Compazine to her pharmacy.  She was advised not to take any other NSAIDs while taking ketorolac and she was advised that the Compazine may make her sleepy so she is not to drive or operate machinery if taking it. She was also advised that she needs to see her neurologist since she is having numerous headache days per month. If symptoms change or worsen, she is to return to the ER.      FINAL CLINICAL IMPRESSION(S) / ED DIAGNOSES   Final diagnoses:  Other migraine without status migrainosus, not intractable     Rx / DC Orders   ED Discharge Orders          Ordered    ketorolac (TORADOL) 10 MG tablet  Every 6 hours PRN       Note to Pharmacy: Injection given in ER   09/20/23 1212    prochlorperazine (COMPAZINE) 10 MG tablet  Every 6 hours PRN        09/20/23 1212             Note:  This document was prepared using Dragon voice recognition software and may include unintentional dictation errors.   Chinita Pester, FNP 09/20/23 1405    Claybon Jabs, MD 09/20/23 [phone]

## 2023-09-20 NOTE — ED Triage Notes (Signed)
 Complains of severe headache with hx of migraines and vomiting.  Reports pain in her chest due to vomiting.  Denies vision changes.

## 2023-09-20 NOTE — Discharge Instructions (Signed)
 Do not take ibuprofen, Advil, or other NSAIDs if you are taking the ketorolac.  Be aware the nausea medication make make you drowsy so do not drive or operate machinery for at least 8 hours after taking it.  Follow up with your neurologist to discuss treatment since you are having so many headache days per month.

## 2023-09-20 NOTE — ED Notes (Signed)
 See triage note Presents with headache for the past 3 days States she has a hx of migraines  Usually gets them about 3 times a week  Her medication usually helps  But this morning it has not helped  pos n/v

## 2023-09-21 DIAGNOSIS — R7989 Other specified abnormal findings of blood chemistry: Secondary | ICD-10-CM | POA: Diagnosis not present

## 2023-09-28 DIAGNOSIS — Z23 Encounter for immunization: Secondary | ICD-10-CM | POA: Diagnosis not present

## 2023-09-28 DIAGNOSIS — E2749 Other adrenocortical insufficiency: Secondary | ICD-10-CM | POA: Diagnosis not present

## 2023-09-28 DIAGNOSIS — E236 Other disorders of pituitary gland: Secondary | ICD-10-CM | POA: Diagnosis not present

## 2023-09-28 DIAGNOSIS — E232 Diabetes insipidus: Secondary | ICD-10-CM | POA: Diagnosis not present

## 2024-01-05 DIAGNOSIS — E2749 Other adrenocortical insufficiency: Secondary | ICD-10-CM | POA: Diagnosis not present

## 2024-01-06 DIAGNOSIS — E236 Other disorders of pituitary gland: Secondary | ICD-10-CM | POA: Diagnosis not present

## 2024-01-06 DIAGNOSIS — E232 Diabetes insipidus: Secondary | ICD-10-CM | POA: Diagnosis not present

## 2024-01-06 DIAGNOSIS — E2749 Other adrenocortical insufficiency: Secondary | ICD-10-CM | POA: Diagnosis not present

## 2024-03-21 ENCOUNTER — Other Ambulatory Visit: Payer: Self-pay

## 2024-03-21 ENCOUNTER — Emergency Department

## 2024-03-21 ENCOUNTER — Emergency Department
Admission: EM | Admit: 2024-03-21 | Discharge: 2024-03-21 | Disposition: A | Discharge status: Home / Self Care | Attending: Emergency Medicine | Admitting: Emergency Medicine

## 2024-03-21 DIAGNOSIS — M4317 Spondylolisthesis, lumbosacral region: Secondary | ICD-10-CM | POA: Diagnosis not present

## 2024-03-21 DIAGNOSIS — S40012A Contusion of left shoulder, initial encounter: Secondary | ICD-10-CM | POA: Diagnosis not present

## 2024-03-21 DIAGNOSIS — M47812 Spondylosis without myelopathy or radiculopathy, cervical region: Secondary | ICD-10-CM | POA: Diagnosis not present

## 2024-03-21 DIAGNOSIS — I878 Other specified disorders of veins: Secondary | ICD-10-CM | POA: Diagnosis not present

## 2024-03-21 DIAGNOSIS — S161XXA Strain of muscle, fascia and tendon at neck level, initial encounter: Secondary | ICD-10-CM | POA: Insufficient documentation

## 2024-03-21 DIAGNOSIS — M47816 Spondylosis without myelopathy or radiculopathy, lumbar region: Secondary | ICD-10-CM | POA: Diagnosis not present

## 2024-03-21 DIAGNOSIS — Z041 Encounter for examination and observation following transport accident: Secondary | ICD-10-CM | POA: Diagnosis not present

## 2024-03-21 DIAGNOSIS — Y92410 Unspecified street and highway as the place of occurrence of the external cause: Secondary | ICD-10-CM | POA: Diagnosis not present

## 2024-03-21 DIAGNOSIS — M542 Cervicalgia: Secondary | ICD-10-CM | POA: Diagnosis not present

## 2024-03-21 DIAGNOSIS — R519 Headache, unspecified: Secondary | ICD-10-CM | POA: Diagnosis not present

## 2024-03-21 DIAGNOSIS — M25512 Pain in left shoulder: Secondary | ICD-10-CM | POA: Diagnosis not present

## 2024-03-21 DIAGNOSIS — M19012 Primary osteoarthritis, left shoulder: Secondary | ICD-10-CM | POA: Diagnosis not present

## 2024-03-21 DIAGNOSIS — S0993XA Unspecified injury of face, initial encounter: Secondary | ICD-10-CM | POA: Diagnosis not present

## 2024-03-21 MED ORDER — IBUPROFEN 600 MG PO TABS
600.0000 mg | ORAL_TABLET | Freq: Once | ORAL | Status: AC
  Administered 2024-03-21: 600 mg via ORAL
  Filled 2024-03-21: qty 1

## 2024-03-21 MED ORDER — CYCLOBENZAPRINE HCL 10 MG PO TABS
5.0000 mg | ORAL_TABLET | Freq: Once | ORAL | Status: AC
  Administered 2024-03-21: 5 mg via ORAL
  Filled 2024-03-21: qty 1

## 2024-03-21 MED ORDER — CYCLOBENZAPRINE HCL 7.5 MG PO TABS: 7.5000 mg | ORAL_TABLET | Freq: Three times a day (TID) | ORAL | 0 refills | Status: AC | PRN

## 2024-03-21 NOTE — Discharge Instructions (Addendum)
 Do not drive within 8 hours of using cyclobenzaprine .
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
## 2024-04-07 DIAGNOSIS — E2749 Other adrenocortical insufficiency: Secondary | ICD-10-CM | POA: Diagnosis not present

## 2024-04-07 DIAGNOSIS — J029 Acute pharyngitis, unspecified: Secondary | ICD-10-CM | POA: Diagnosis not present

## 2024-04-07 DIAGNOSIS — Z03818 Encounter for observation for suspected exposure to other biological agents ruled out: Secondary | ICD-10-CM | POA: Diagnosis not present

## 2024-04-07 DIAGNOSIS — U071 COVID-19: Secondary | ICD-10-CM | POA: Diagnosis not present

## 2024-04-07 DIAGNOSIS — E236 Other disorders of pituitary gland: Secondary | ICD-10-CM | POA: Diagnosis not present

## 2024-04-11 DIAGNOSIS — E236 Other disorders of pituitary gland: Secondary | ICD-10-CM | POA: Diagnosis not present

## 2024-04-11 DIAGNOSIS — E232 Diabetes insipidus: Secondary | ICD-10-CM | POA: Diagnosis not present

## 2024-04-11 DIAGNOSIS — E2749 Other adrenocortical insufficiency: Secondary | ICD-10-CM | POA: Diagnosis not present

## 2024-04-15 ENCOUNTER — Other Ambulatory Visit: Payer: Self-pay | Admitting: Surgery

## 2024-04-15 DIAGNOSIS — E236 Other disorders of pituitary gland: Secondary | ICD-10-CM | POA: Diagnosis not present

## 2024-04-15 DIAGNOSIS — S39012A Strain of muscle, fascia and tendon of lower back, initial encounter: Secondary | ICD-10-CM

## 2024-04-15 DIAGNOSIS — S161XXA Strain of muscle, fascia and tendon at neck level, initial encounter: Secondary | ICD-10-CM

## 2024-04-15 DIAGNOSIS — E232 Diabetes insipidus: Secondary | ICD-10-CM | POA: Diagnosis not present

## 2024-04-15 DIAGNOSIS — E2749 Other adrenocortical insufficiency: Secondary | ICD-10-CM | POA: Diagnosis not present

## 2024-04-25 ENCOUNTER — Other Ambulatory Visit

## 2024-05-04 ENCOUNTER — Ambulatory Visit
Admission: RE | Admit: 2024-05-04 | Discharge: 2024-05-04 | Disposition: A | Source: Ambulatory Visit | Attending: Surgery | Admitting: Surgery

## 2024-05-04 ENCOUNTER — Ambulatory Visit
Admission: RE | Admit: 2024-05-04 | Discharge: 2024-05-04 | Disposition: A | Discharge status: Home / Self Care | Source: Ambulatory Visit | Attending: Surgery | Admitting: Surgery

## 2024-05-04 DIAGNOSIS — S39012A Strain of muscle, fascia and tendon of lower back, initial encounter: Secondary | ICD-10-CM

## 2024-05-04 DIAGNOSIS — S161XXA Strain of muscle, fascia and tendon at neck level, initial encounter: Secondary | ICD-10-CM

## 2024-05-13 ENCOUNTER — Other Ambulatory Visit: Payer: Self-pay | Admitting: Surgery

## 2024-05-13 DIAGNOSIS — S161XXA Strain of muscle, fascia and tendon at neck level, initial encounter: Secondary | ICD-10-CM

## 2024-06-30 ENCOUNTER — Emergency Department
Admission: EM | Admit: 2024-06-30 | Discharge: 2024-06-30 | Disposition: A | Discharge status: Home / Self Care | Payer: Self-pay

## 2024-06-30 ENCOUNTER — Emergency Department: Payer: Self-pay

## 2024-06-30 ENCOUNTER — Encounter: Payer: Self-pay | Admitting: *Deleted

## 2024-06-30 ENCOUNTER — Other Ambulatory Visit: Payer: Self-pay

## 2024-06-30 DIAGNOSIS — S61217A Laceration without foreign body of left little finger without damage to nail, initial encounter: Secondary | ICD-10-CM | POA: Insufficient documentation

## 2024-06-30 DIAGNOSIS — Z23 Encounter for immunization: Secondary | ICD-10-CM | POA: Insufficient documentation

## 2024-06-30 DIAGNOSIS — W260XXA Contact with knife, initial encounter: Secondary | ICD-10-CM | POA: Insufficient documentation

## 2024-06-30 MED ORDER — IBUPROFEN 600 MG PO TABS
600.0000 mg | ORAL_TABLET | Freq: Once | ORAL | Status: AC
  Administered 2024-06-30: 600 mg via ORAL
  Filled 2024-06-30: qty 1

## 2024-06-30 MED ORDER — TETANUS-DIPHTH-ACELL PERTUSSIS 5-2-15.5 LF-MCG/0.5 IM SUSP
0.5000 mL | Freq: Once | INTRAMUSCULAR | Status: AC
  Administered 2024-06-30: 0.5 mL via INTRAMUSCULAR
  Filled 2024-06-30: qty 0.5
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
## 2024-06-30 NOTE — Discharge Instructions (Addendum)
 You have been diagnosed with laceration of the left little finger.  Please do not submerge your finger in water.  Please take acetaminophen  every 8 hours as needed for pain.  Glue will come out by itself in 5 to 10 days.  Please come back to ED or go to your PCP if you have new symptoms or symptoms worsen.  It was a pleasure to help you today.  Shilee Biggs, PA-C

## 2024-06-30 NOTE — ED Triage Notes (Signed)
 Pt reports she was cutting a pineapple just prior to arrival and cut her left hand pinky finger. Cut through the nail and medial pinky area- no bleeding in triage. Wet to dry dressing placed in triage. Unknown last tetanus.

## 2024-08-24 ENCOUNTER — Other Ambulatory Visit: Payer: Self-pay | Admitting: Family Medicine

## 2024-08-24 DIAGNOSIS — Z1231 Encounter for screening mammogram for malignant neoplasm of breast: Secondary | ICD-10-CM
